# Patient Record
Sex: Female | Born: 1955 | Race: Black or African American | Hispanic: No | Smoking: Former smoker
Health system: Southern US, Community
[De-identification: ages and names within clinical notes are randomized; demographics above are authoritative.]

## PROBLEM LIST (undated history)

## (undated) DIAGNOSIS — E7800 Pure hypercholesterolemia, unspecified: Secondary | ICD-10-CM

## (undated) HISTORY — DX: Pure hypercholesterolemia, unspecified: E78.00

## (undated) HISTORY — PX: KNEE SURGERY: SHX244

## (undated) HISTORY — PX: BUNIONECTOMY: SHX129

## (undated) HISTORY — PX: CERVICAL SPINE SURGERY: SHX589

## (undated) HISTORY — PX: OTHER SURGICAL HISTORY: SHX169

---

## 1998-05-16 ENCOUNTER — Ambulatory Visit (HOSPITAL_BASED_OUTPATIENT_CLINIC_OR_DEPARTMENT_OTHER): Admission: RE | Admit: 1998-05-16 | Discharge: 1998-05-16 | Payer: Self-pay | Admitting: Orthopedic Surgery

## 1998-08-22 ENCOUNTER — Ambulatory Visit (HOSPITAL_BASED_OUTPATIENT_CLINIC_OR_DEPARTMENT_OTHER): Admission: RE | Admit: 1998-08-22 | Discharge: 1998-08-22 | Payer: Self-pay | Admitting: Orthopedic Surgery

## 1999-01-23 ENCOUNTER — Encounter: Payer: Self-pay | Admitting: Obstetrics and Gynecology

## 1999-01-23 ENCOUNTER — Ambulatory Visit (HOSPITAL_COMMUNITY): Admission: RE | Admit: 1999-01-23 | Discharge: 1999-01-23 | Payer: Self-pay | Admitting: Obstetrics and Gynecology

## 1999-08-28 ENCOUNTER — Ambulatory Visit (HOSPITAL_BASED_OUTPATIENT_CLINIC_OR_DEPARTMENT_OTHER): Admission: RE | Admit: 1999-08-28 | Discharge: 1999-08-28 | Payer: Self-pay | Admitting: Orthopedic Surgery

## 1999-12-18 ENCOUNTER — Encounter: Payer: Self-pay | Admitting: Neurosurgery

## 1999-12-23 ENCOUNTER — Inpatient Hospital Stay (HOSPITAL_COMMUNITY): Admission: RE | Admit: 1999-12-23 | Discharge: 1999-12-25 | Payer: Self-pay | Admitting: Neurosurgery

## 1999-12-23 ENCOUNTER — Encounter: Payer: Self-pay | Admitting: Neurosurgery

## 2000-01-13 ENCOUNTER — Encounter: Admission: RE | Admit: 2000-01-13 | Discharge: 2000-01-13 | Payer: Self-pay | Admitting: Neurosurgery

## 2000-01-13 ENCOUNTER — Encounter: Payer: Self-pay | Admitting: Neurosurgery

## 2000-02-08 ENCOUNTER — Encounter: Payer: Self-pay | Admitting: Neurosurgery

## 2000-02-08 ENCOUNTER — Ambulatory Visit (HOSPITAL_COMMUNITY): Admission: RE | Admit: 2000-02-08 | Discharge: 2000-02-08 | Payer: Self-pay | Admitting: Neurosurgery

## 2000-04-05 ENCOUNTER — Encounter: Admission: RE | Admit: 2000-04-05 | Discharge: 2000-04-05 | Payer: Self-pay | Admitting: Neurosurgery

## 2000-04-05 ENCOUNTER — Encounter: Payer: Self-pay | Admitting: Neurosurgery

## 2000-06-06 ENCOUNTER — Encounter: Admission: RE | Admit: 2000-06-06 | Discharge: 2000-06-06 | Payer: Self-pay | Admitting: Neurosurgery

## 2000-06-06 ENCOUNTER — Encounter: Payer: Self-pay | Admitting: Neurosurgery

## 2000-06-21 ENCOUNTER — Ambulatory Visit (HOSPITAL_COMMUNITY): Admission: RE | Admit: 2000-06-21 | Discharge: 2000-06-21 | Payer: Self-pay | Admitting: Neurosurgery

## 2000-06-21 ENCOUNTER — Encounter: Payer: Self-pay | Admitting: Neurosurgery

## 2000-10-26 ENCOUNTER — Ambulatory Visit (HOSPITAL_COMMUNITY): Admission: RE | Admit: 2000-10-26 | Discharge: 2000-10-26 | Payer: Self-pay | Admitting: Orthopedic Surgery

## 2000-10-26 ENCOUNTER — Encounter: Payer: Self-pay | Admitting: Orthopedic Surgery

## 2000-12-20 ENCOUNTER — Other Ambulatory Visit: Admission: RE | Admit: 2000-12-20 | Discharge: 2000-12-20 | Payer: Self-pay | Admitting: *Deleted

## 2003-05-20 ENCOUNTER — Emergency Department (HOSPITAL_COMMUNITY): Admission: EM | Admit: 2003-05-20 | Discharge: 2003-05-21 | Payer: Self-pay | Admitting: Emergency Medicine

## 2004-09-16 ENCOUNTER — Emergency Department (HOSPITAL_COMMUNITY): Admission: EM | Admit: 2004-09-16 | Discharge: 2004-09-17 | Payer: Self-pay | Admitting: *Deleted

## 2005-09-15 ENCOUNTER — Emergency Department (HOSPITAL_COMMUNITY): Admission: EM | Admit: 2005-09-15 | Discharge: 2005-09-15 | Payer: Self-pay | Admitting: Emergency Medicine

## 2006-01-23 ENCOUNTER — Emergency Department (HOSPITAL_COMMUNITY): Admission: EM | Admit: 2006-01-23 | Discharge: 2006-01-23 | Payer: Self-pay | Admitting: Family Medicine

## 2008-07-12 ENCOUNTER — Inpatient Hospital Stay (HOSPITAL_COMMUNITY): Admission: RE | Admit: 2008-07-12 | Discharge: 2008-07-16 | Payer: Self-pay | Admitting: Urology

## 2008-09-23 ENCOUNTER — Encounter: Payer: Self-pay | Admitting: Family

## 2009-05-02 IMAGING — CT CT ABDOMEN W/O CM
2 of 4 series · 17 of 46 positions shown, 19 images · non-contrast
Comparison: CT scan performed at [HOSPITAL] on 06/19/2008

CLINICAL DATA: Left flank pain.  Status post left renal stent
placed yesterday.

CT ABDOMEN WITHOUT CONTRAST
TECHNIQUE: Multidetector CT imaging of the abdomen was performed
following the standard protocol without IV contrast.

[Series 2: stone_wo 5.0 b40f st · axial · 0.64mm/px · z∈[-252,-27]mm · 14 of 51 slices shown, 16 images]
[im 3/51  soft-tissue]
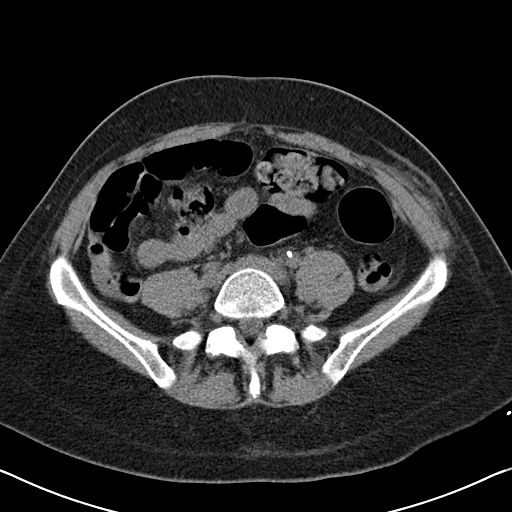
[im 3/51  bone]
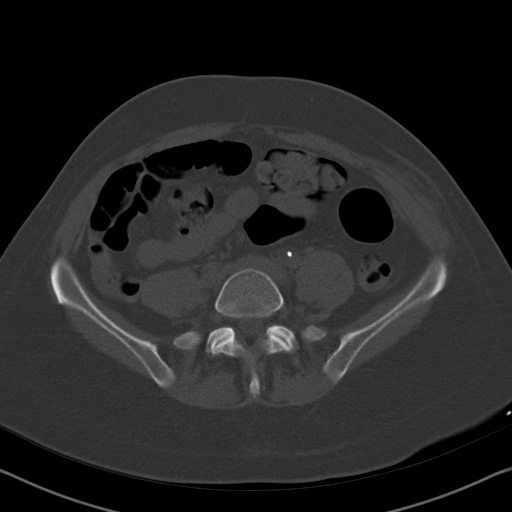
[im 7/51  soft-tissue]
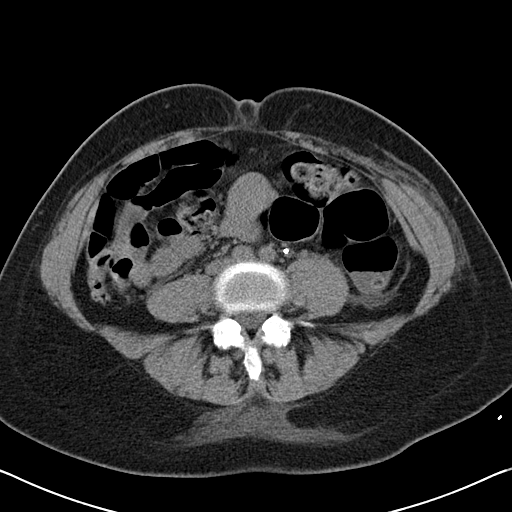
[im 9/51  soft-tissue]
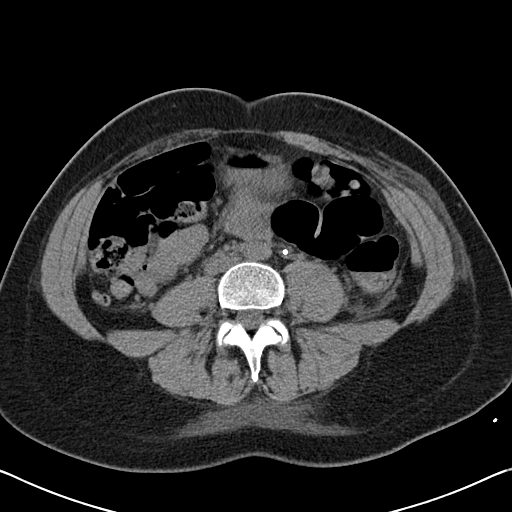
[im 14/51  soft-tissue]
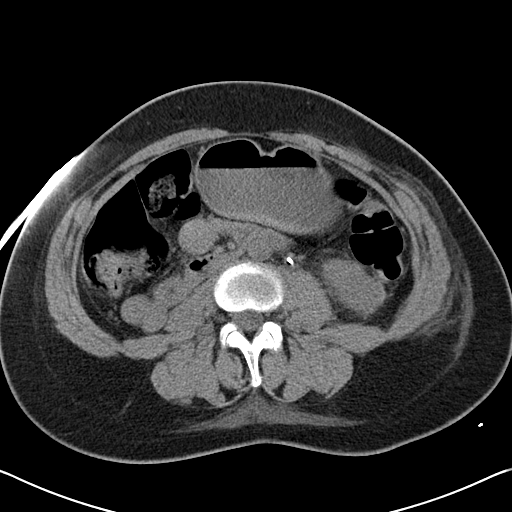
[im 18/51  soft-tissue]
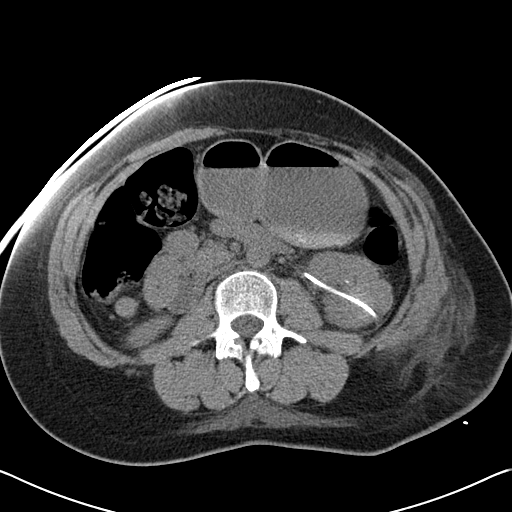
[im 20/51  soft-tissue]
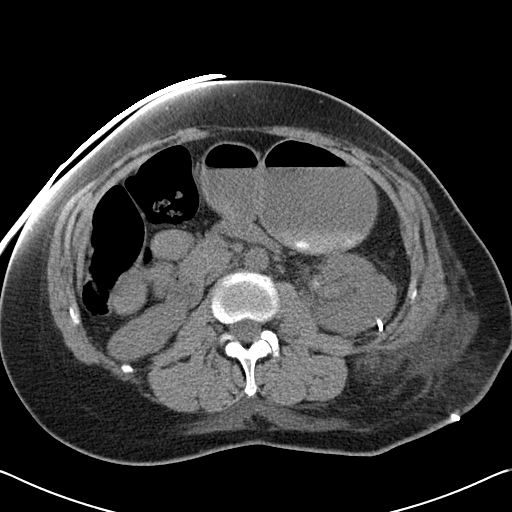
[im 24/51  soft-tissue]
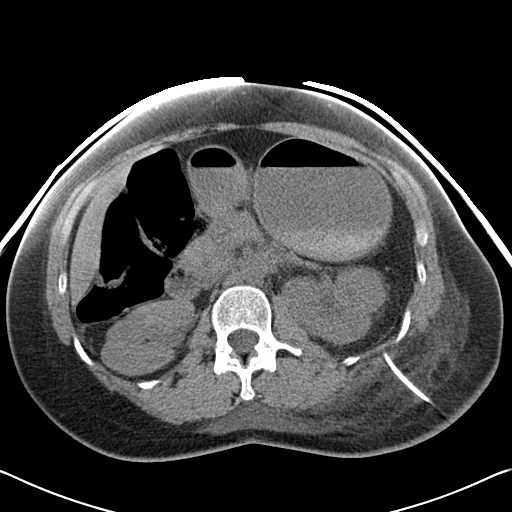
[im 27/51  soft-tissue]
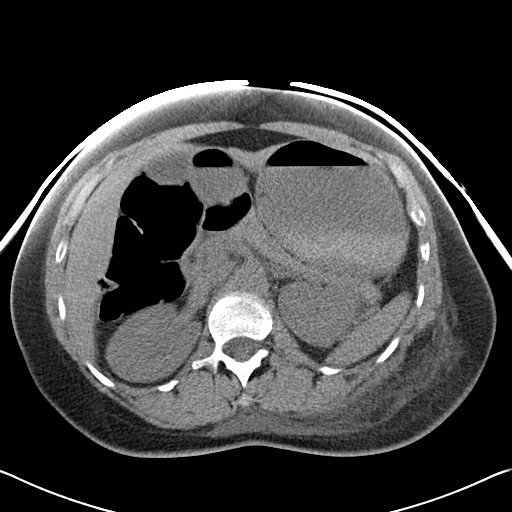
[im 31/51  soft-tissue]
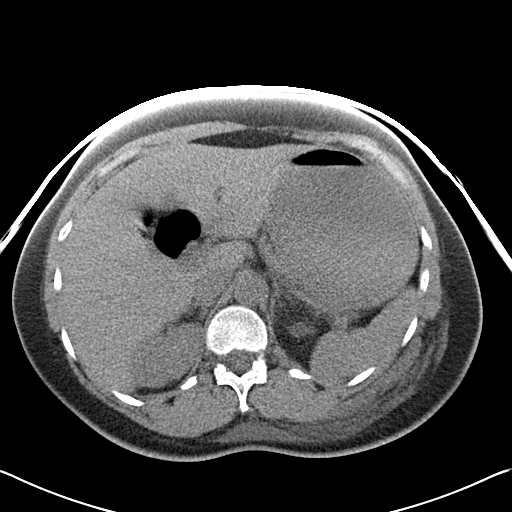
[im 31/51  bone]
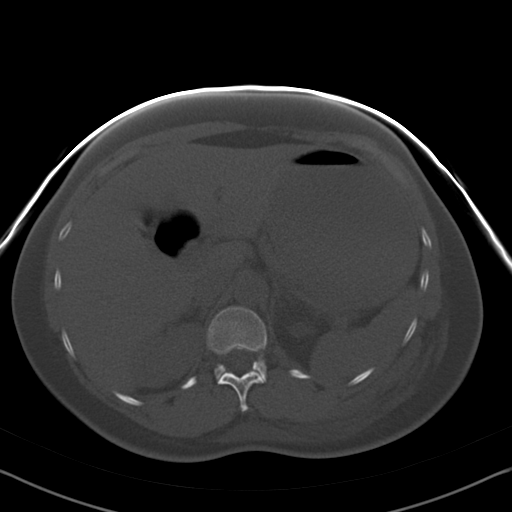
[im 33/51  soft-tissue]
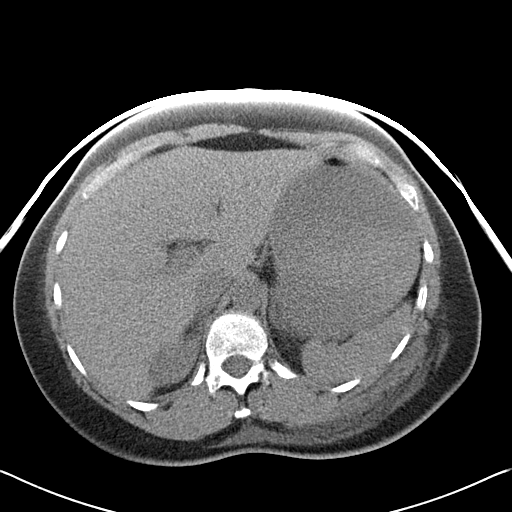
[im 37/51  soft-tissue]
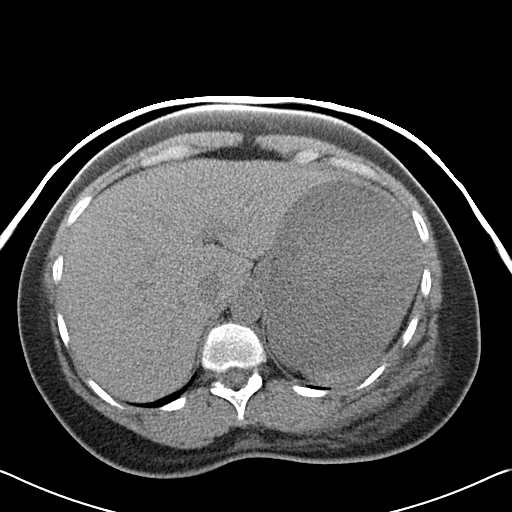
[im 42/51  soft-tissue]
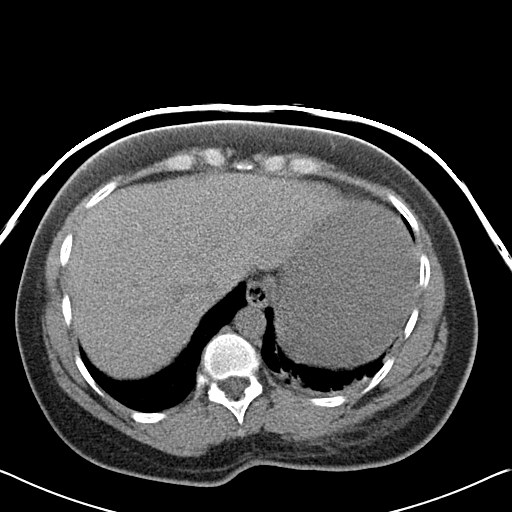
[im 44/51  soft-tissue]
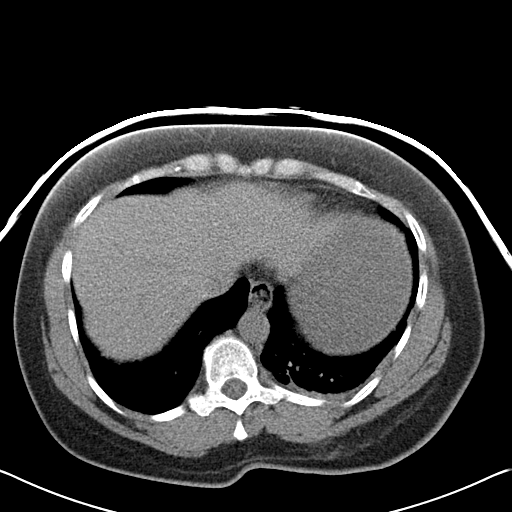
[im 48/51  soft-tissue]
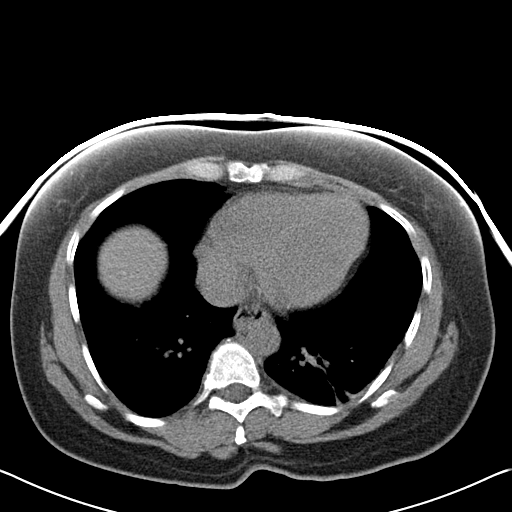

[Series 602: coronal abdomen · coronal · 0.64mm/px · 3 of 114 slices shown]
[im 38/114  soft-tissue]
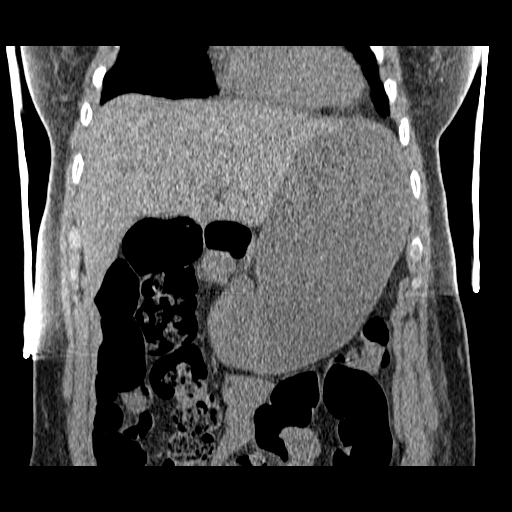
[im 51/114  soft-tissue]
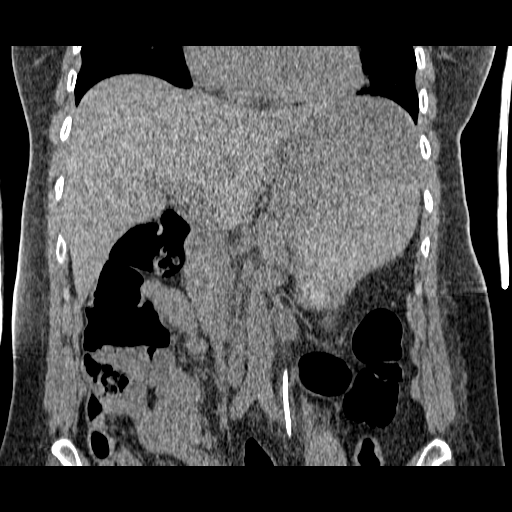
[im 63/114  soft-tissue]
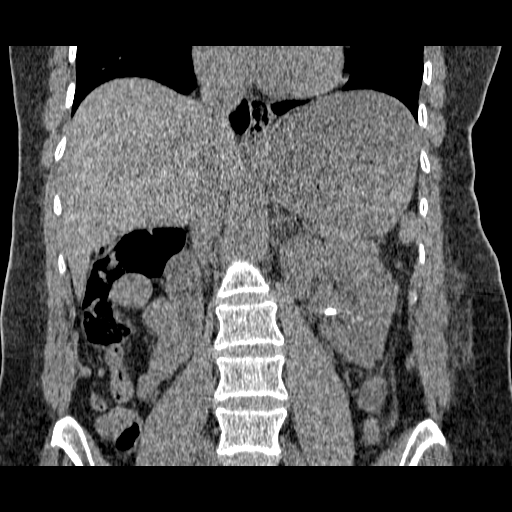

[17 of 46 positions shown; findings below may reference images not displayed]

FINDINGS: The large stone seen on the prior CT scan has been
removed.  There are two tiny stones in the midportion of the left
kidney.  There is a left ureteral stent catheter in place in good
position.  There is only minimal perinephric stranding on the left.
There is some edema in the subcutaneous fat of the left flank which
is thought to be postoperative in origin.  There is no definable
hematoma.  There is slight atelectasis at the left lung base.

The adjacent spleen and left adrenal gland are normal.

The unenhanced liver and pancreas and right adrenal gland and right
kidney appear normal.

There is no free air or free fluid.
IMPRESSION: Satisfactory postoperative appearance of the abdomen with a left
ureteral stent in place.  The stone seen in the renal pelvis on the
prior CT scan has been removed.  There are two small calculi in the
mid left kidney.

Expected edema in the soft tissues of the left flank adjacent to
the site of the stent placement.  No significant left
hydronephrosis.

## 2010-01-19 ENCOUNTER — Ambulatory Visit: Payer: Self-pay | Admitting: Family

## 2010-01-19 ENCOUNTER — Ambulatory Visit (HOSPITAL_BASED_OUTPATIENT_CLINIC_OR_DEPARTMENT_OTHER): Admission: RE | Admit: 2010-01-19 | Discharge: 2010-01-19 | Payer: Self-pay | Admitting: Internal Medicine

## 2010-01-19 ENCOUNTER — Ambulatory Visit: Payer: Self-pay | Admitting: Diagnostic Radiology

## 2010-01-19 DIAGNOSIS — N951 Menopausal and female climacteric states: Secondary | ICD-10-CM | POA: Insufficient documentation

## 2010-01-20 ENCOUNTER — Telehealth: Payer: Self-pay | Admitting: Family

## 2010-01-21 ENCOUNTER — Encounter: Payer: Self-pay | Admitting: Family

## 2010-01-22 ENCOUNTER — Telehealth: Payer: Self-pay | Admitting: Family

## 2010-02-03 ENCOUNTER — Telehealth: Payer: Self-pay | Admitting: Family

## 2010-02-03 ENCOUNTER — Encounter: Payer: Self-pay | Admitting: Family

## 2010-02-04 ENCOUNTER — Encounter: Payer: Self-pay | Admitting: Family

## 2010-02-10 ENCOUNTER — Encounter: Admission: RE | Admit: 2010-02-10 | Discharge: 2010-02-10 | Payer: Self-pay | Admitting: Internal Medicine

## 2010-02-11 ENCOUNTER — Telehealth: Payer: Self-pay | Admitting: Family

## 2010-05-09 ENCOUNTER — Encounter: Payer: Self-pay | Admitting: Obstetrics and Gynecology

## 2010-05-17 LAB — CONVERTED CEMR LAB
ALT: 18 units/L (ref 0–35)
AST: 18 units/L (ref 0–37)
Albumin: 4.4 g/dL (ref 3.5–5.2)
Alkaline Phosphatase: 78 units/L (ref 39–117)
BUN: 21 mg/dL (ref 6–23)
Bilirubin, Direct: 0.1 mg/dL (ref 0.0–0.3)
Chloride: 109 meq/L (ref 96–112)
Glucose, Bld: 72 mg/dL (ref 70–99)
Lymphs Abs: 2.3 10*3/uL (ref 0.7–4.0)
Monocytes Relative: 9 % (ref 3–12)
Neutro Abs: 1.6 10*3/uL — ABNORMAL LOW (ref 1.7–7.7)
Neutrophils Relative %: 38 % — ABNORMAL LOW (ref 43–77)
Pap Smear: NORMAL
Potassium: 4.3 meq/L (ref 3.5–5.3)
RBC: 4.7 M/uL (ref 3.87–5.11)
TSH: 0.558 microintl units/mL (ref 0.350–4.500)
WBC: 4.3 10*3/uL (ref 4.0–10.5)

## 2010-05-18 ENCOUNTER — Telehealth: Payer: Self-pay | Admitting: Family

## 2010-05-18 ENCOUNTER — Encounter: Payer: Self-pay | Admitting: Family

## 2010-05-19 NOTE — Assessment & Plan Note (Signed)
Summary: new to be est [employer]--Rm 5   Vital Signs:  Patient profile:   55 year old female LMP:     04/19/2002 Height:      63 inches Weight:      158.50 pounds BMI:     28.18 Temp:     97.9 degrees F oral Pulse rate:   66 / minute Pulse rhythm:   regular Resp:     16 per minute BP sitting:   100 / 76  (right arm) Cuff size:   regular  Vitals Entered By: Mervin Kung CMA (AAMA) (January 19, 2010 10:10 AM) CC: Rm 5  Pt new to establish care. Pt needs work physical.   Is Patient Diabetic? No Comments Pt would like to discuss treatment of hot flashes. Nicki Guadalajara Fergerson CMA Duncan Dull)  January 19, 2010 10:17 AM  LMP (date): 04/19/2002     Enter LMP: 04/19/2002 Last PAP Result normal   CC:  Rm 5  Pt new to establish care. Pt needs work physical.  .  History of Present Illness: Ms Sullivant is a 55 year old female who presents today to establish care.  She wishes to have a complete physical, and also wishes to address her hot flashes.  She has a form which she would like to have completed for her job.  Previously, she  has been followed by a Chief Operating Officer in [place].  She does not recall her name, but will contact us with this information so that we can retrieve her old records.   1. Preventative- Pt is up to date on colonoscopy, requesting flu shot.  She believes that she is up to date on tetanus- has records at home and will contact us with this information.  Patient is due for mammogram.  Last Pap was 1 year ago and was normal.  Pt reports that she has received regular pap smears and that they have always been normal.    2. Cervical spine fusion 2005- no symptoms at present.    3. Hot flashes-  x 1 year. Having symptoms every hour.  Feels like it is affecting her at work.  Hard for her to sleep.  LMP was 7 years ago.    Preventive Screening-Counseling & Management  Alcohol-Tobacco     Alcohol drinks/day: 0     Smoking Status: never  Caffeine-Diet-Exercise     Caffeine  use/day: <1 daily     Does Patient Exercise: no  Allergies (verified): 1)  ! * Dilaudid  Past History:  Past Medical History: None  Past Surgical History: Percutaneous kidney stone removed-- 2010 Left knee surgery-- 1975 / 1976 Cervical spine surgery-- 2005 Right rotator cuff repair-- 1999  Family History: Father-- living; diabetes, htn, gout, cancer left kidney Mother--living, HTN, hypercholesterolemia, carpal tunnel syndrome 1 brother--htn 1 sister--htn 1 daughter--healthy Denies family medical history of breast cancer.    Social History: Occupation: Works as  Engineer, civil (consulting) Divorced Never Smoked Alcohol use-no Regular exercise-no Smoking Status:  never Caffeine use/day:  <1 daily Does Patient Exercise:  no  Review of Systems       Constitutional: Denies Fever ENT:  Denies nasal congestion or sore throat. Resp: Denies cough CV:  Denies Chest Pain or shortness of breath GI:  Denies nausea or vomitting GU: Denies dysuria Lymphatic: Denies lymphadenopathy Musculoskeletal:  some soreness on the left side of her neck Skin:  Denies Rashes Psychiatric: some depression since her hot flashes started.  Neuro: Denies numbness     Physical  Exam  General:  Well-developed,well-nourished,in no acute distress; alert,appropriate and cooperative throughout examination Head:  Normocephalic and atraumatic without obvious abnormalities. No apparent alopecia or balding. Eyes:  PERRLA Ears:  External ear exam shows no significant lesions or deformities.  Otoscopic examination reveals clear canals, tympanic membranes are intact bilaterally without bulging, retraction, inflammation or discharge. Hearing is grossly normal bilaterally. Mouth:  Oral mucosa and oropharynx without lesions or exudates.  Teeth in good repair. Neck:  No deformities, masses, or tenderness noted. Breasts:  No mass, nodules, thickening, tenderness, bulging, retraction, inflamation, nipple discharge or skin changes  noted.   Lungs:  Normal respiratory effort, chest expands symmetrically. Lungs are clear to auscultation, no crackles or wheezes. Heart:  Normal rate and regular rhythm. S1 and S2 normal without gallop, murmur, click, rub or other extra sounds. Abdomen:  Bowel sounds positive,abdomen soft and non-tender without masses, organomegaly or hernias noted. Genitalia:  deferred Msk:  No deformity or scoliosis noted of thoracic or lumbar spine.   Pulses:  R and L carotid,radial,femoral,dorsalis pedis and posterior tibial pulses are full and equal bilaterally Extremities:  No clubbing, cyanosis, edema, or deformity noted with normal full range of motion of all joints.   Neurologic:  No cranial nerve deficits noted. Station and gait are normal. Plantar reflexes are down-going bilaterally. DTRs are symmetrical throughout. Sensory, motor and coordinative functions appear intact. Skin:  Intact without suspicious lesions or rashes Psych:  Cognition and judgment appear intact. Alert and cooperative with normal attention span and concentration. No apparent delusions, illusions, hallucinations   Impression & Recommendations:  Problem # 1:  Preventive Health Care (ICD-V70.0) Assessment Comment Only Flu shot given today.  Patient was counselled on importance of regular exercise.  Referral provided for mammogram.  Check labs as below.  Will plan to check FLP next visit fasting.  Will plan to repeat her pap smear in 3 years as all pap smears have been reported as normal and pt is not increased risk.   Orders: TLB-BMP (Basic Metabolic Panel-BMET) (80048-METABOL) TLB-CBC Platelet - w/Differential (85025-CBCD) TLB-Hepatic/Liver Function Pnl (80076-HEPATIC) TLB-TSH (Thyroid Stimulating Hormone) (84443-TSH) Mammogram (Screening) (Mammo) EKG w/ Interpretation (93000)  Problem # 2:  MENOPAUSE-RELATED VASOMOTOR SYMPTOMS, HOT FLASHES (ICD-627.2) Assessment: Deteriorated Recommended trial of effexor.  Initially she  agreed to this, however she ultimately declined due to concern about difficulty weaning off of med in the future.  Will plan to check below labs and will consider low dose HRT.   Orders: T-Estradiol [id number]) T-FSH [id number]) T-LH [id number])  Complete Medication List: 1)  Multivitamins Tabs (Multiple vitamin) .... One tablet by mouth daily 2)  Venlafaxine Hcl 37.5 Mg Tabs (Venlafaxine hcl) .... One tablet by mouth two times a day  Other Orders: Admin 1st Vaccine (57846) Flu Vaccine 91yrs + (96295)  Patient Instructions: 1)  Please call us with the information on the name of your former health care provider.   2)  Try to walk 30 minutes a day.  3)  Schedule mammogram downstairs today. 4)  Pls complete your lab work on the first floor. 5)  Please return fasting to the lab at your earliest convenience to complete your fasting cholesterol. 6)  Follow up in 1 month- come fasting to this appointment. We will plan to do your cholesterol that day and see how things are going with your hot flashes. 7)  Welcome to [facility]! Prescriptions: VENLAFAXINE HCL 37.5 MG TABS (VENLAFAXINE HCL) one tablet by mouth two times a day  #60 x 1  Entered and Authorized by:   Lemont Fillers FNP   Signed by:   Lemont Fillers FNP on 01/19/2010   Method used:   Electronically to        [facility] # [phone]* (retail)       [address]       [place], [place]  [address]       Ph: [phone]       Fax: [phone]   RxID:   [id number]  Pt decided not to try Venlafaxine. Melissa was notified and rx was cancelled at [facility]. Nicki Guadalajara Fergerson CMA Duncan Dull)  January 19, 2010 12:04 PM  Current Allergies (reviewed today): ! * DILAUDID   Preventive Care Screening  PPD:    Date:  10/17/2009    Results:  negative   Colonoscopy:    Date:  11/17/2008    Results:  normal   Mammogram:    Date:  11/17/2008    Results:  normal   Pap Smear:    Date:   10/17/2008    Results:  normal      Never had bone density. Can't remember last tetanus. Nicki Guadalajara Fergerson CMA Duncan Dull)  January 19, 2010 10:23 AM    Flu Vaccine Consent Questions     Do you have a history of severe allergic reactions to this vaccine? no    Any prior history of allergic reactions to egg and/or gelatin? no    Do you have a sensitivity to the preservative Thimersol? no    Do you have a past history of Guillan-Barre Syndrome? no    Do you currently have an acute febrile illness? no    Have you ever had a severe reaction to latex? no    Vaccine information given and explained to patient? yes    Are you currently pregnant? no    Lot Number:[id number]   Exp Date:10/17/2010   Site Given  Left Deltoid IM. Nicki Guadalajara Fergerson CMA Duncan Dull)  January 19, 2010 11:26 AM

## 2010-05-19 NOTE — Letter (Signed)
Summary: Employee Health Form/[employer]  Needs OV/[employer]   Imported By: Lanelle Bal 02/06/2010 12:07:51  _____________________________________________________________________  External Attachment:    Type:   Image     Comment:   External Document

## 2010-05-19 NOTE — Letter (Signed)
Summary: Records from [facility] 2010  Records from [facility] 2010   Imported By: Maryln Gottron 02/13/2010 15:14:49  _____________________________________________________________________  External Attachment:    Type:   Image     Comment:   External Document

## 2010-05-19 NOTE — Progress Notes (Signed)
  Phone Note Outgoing Call   Call placed by: Lemont Fillers FNP,  January 20, 2010 4:50 PM Call placed to: L Summary of Call: Left message for patient to return my call.  Initial call taken by: Lemont Fillers FNP,  January 20, 2010 4:51 PM  Follow-up for Phone Call        Spoke with Ms Neyman, reviewed laboratory data.  She reports that she has tried lexapro and gabapentin in the past for her hot flashes which has not helped.  Declines effexor.  Discussed my concern with patient about the fact that her LMP was 7 years ago.  Will plan to refer to GYN for further evaluation and risk assessment for HRT. Pt is agreeable to this plan.  Pt is requesting that her employee health form be faxed to her employer.  Follow-up by: Lemont Fillers FNP,  January 21, 2010 8:53 AM

## 2010-05-19 NOTE — Progress Notes (Signed)
Summary: breast u/s resul  Phone Note Outgoing Call   Summary of Call: Please call patient and let her know that her breast ultrasound shows a cyst.  No sign of breast cancer at this time.  She will need a follow up mammogram in 1 year. Initial call taken by: Lemont Fillers FNP,  February 11, 2010 8:51 AM  Follow-up for Phone Call        Left message on machine to return my call. Nicki Guadalajara Fergerson CMA Duncan Dull)  February 11, 2010 10:42 AM   Additional Follow-up for Phone Call Additional follow up Details #1::        Pt notified and voices understanding. Nicki Guadalajara Fergerson CMA Duncan Dull)  February 11, 2010 10:54 AM

## 2010-05-19 NOTE — Progress Notes (Signed)
Summary: B12 check  Phone Note Call from Patient Call back at [phone]   Caller: Patient Reason for Call: Insurance Question Summary of Call: Pt would like to know if we checked her B vitamin level Initial call taken by: Lannette Donath,  January 22, 2010 1:08 PM  Follow-up for Phone Call        Pt would like [place] to add B12 level to most recent labs due to constant fatigue. Please advise. Nicki Guadalajara Fergerson CMA Duncan Dull)  January 22, 2010 2:54 PM  Follow-up by: Mervin Kung CMA Duncan Dull),  January 22, 2010 2:54 PM  Additional Follow-up for Phone Call Additional follow up Details #1::        Please let patient know that I am happy to add this test, but it is unlikely to be low based on her blood count results and I am not sure that it will be covered by insurance. Additional Follow-up by: Lemont Fillers FNP,  January 23, 2010 8:46 AM    Additional Follow-up for Phone Call Additional follow up Details #2::    Left message on machine to return my call. Nicki Guadalajara Fergerson CMA Duncan Dull)  January 23, 2010 10:42 AM   Pt returned my call and left message requesting that I leave info. on home voicemail.  Left message on machine regarding [name]'s instructions. Asked pt to call me back today and let me know if she still wants B12 added. Also, did pt sign form for job. Nicki Guadalajara Fergerson CMA Duncan Dull)  January 23, 2010 3:00 PM   Pt called back & said not to add B levels [name]  January 23, 2010 4:44 PM  Spoke to pt and she states that she will stop by on Wednesday to sign form for job.  Form has been left at front desk for signature. Nicki Guadalajara Fergerson CMA Duncan Dull)  January 26, 2010 9:43 AM   Additional Follow-up for Phone Call Additional follow up Details #3:: Details for Additional Follow-up Action Taken: Form was signed and pt stated she would fax it herself. Nicki Guadalajara Fergerson CMA Duncan Dull)  January 28, 2010 2:49 PM

## 2010-05-19 NOTE — Progress Notes (Signed)
Summary: RECORDS REC FROM [facility]  Phone Note Other Incoming   Caller: [facility] Summary of Call: RECORDS [facility] FROM [facility] Initial call taken by: Roselle Locus,  February 03, 2010 8:56 AM

## 2010-05-19 NOTE — Miscellaneous (Signed)
Summary: mammogram, tetanus,pneumovax  Clinical Lists Changes  Observations: Added new observation of MAMMOGRAM: normal (10/01/2008 10:31) Added new observation of TD BOOSTER: Historical (09/23/2008 10:31) Added new observation of PNEUMOVAX: Historical (04/20/2007 10:39)      Preventive Care Screening  Mammogram:    Date:  10/01/2008    Results:  normal  Last Tetanus Booster:    Date:  09/23/2008    Results:  Historical    Immunization History:  Pneumovax Immunization History:    Pneumovax:  historical (04/20/2007)

## 2010-05-19 NOTE — Miscellaneous (Signed)
Summary: tetanus  Clinical Lists Changes  Observations: Added new observation of TD BOOSTER: Historical (04/19/2006 13:26)      Immunization History:  Tetanus/Td Immunization History:    Tetanus/Td:  historical (04/19/2006)

## 2010-05-27 NOTE — Progress Notes (Signed)
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 Please advise.  Initial call taken by: Mervin Kung CMA Duncan Dull),  May 18, 2010 2:41 PM  Follow-up for Phone Call        See letter.  Follow-up by: Lemont Fillers FNP,  May 18, 2010 3:04 PM  Additional Follow-up for Phone Call Additional follow up Details #1::        Pt notified letter is ready for pick up and will be at the front desk. Nicki Guadalajara Fergerson CMA Duncan Dull)  May 18, 2010 3:33 PM

## 2010-05-27 NOTE — Letter (Signed)
Summary: Generic Letter  [place] [facility]  [address]   [place], [place] [address]   Phone: [phone]  Fax: [phone]    05/18/2010  [name] [address] [place], [place]  [address]  To whom it may concern,  [name] was seen on 01/19/10 for a complete physical and had her flu shot during that visit.     Sincerely,   Sandford Craze FNP  Appended Document: Generic Letter mailed

## 2010-07-30 LAB — URINALYSIS, ROUTINE W REFLEX MICROSCOPIC
Glucose, UA: NEGATIVE mg/dL
Ketones, ur: NEGATIVE mg/dL
Specific Gravity, Urine: 1.012 (ref 1.005–1.030)
pH: 6 (ref 5.0–8.0)

## 2010-07-30 LAB — CBC
HCT: 32.5 % — ABNORMAL LOW (ref 36.0–46.0)
Hemoglobin: 10.9 g/dL — ABNORMAL LOW (ref 12.0–15.0)
Hemoglobin: 13.5 g/dL (ref 12.0–15.0)
MCHC: 33.5 g/dL (ref 30.0–36.0)
MCHC: 33.5 g/dL (ref 30.0–36.0)
MCV: 94.7 fL (ref 78.0–100.0)
MCV: 95.7 fL (ref 78.0–100.0)
Platelets: 212 10*3/uL (ref 150–400)
Platelets: 230 10*3/uL (ref 150–400)
Platelets: 231 10*3/uL (ref 150–400)
RDW: 11.9 % (ref 11.5–15.5)
RDW: 12 % (ref 11.5–15.5)
RDW: 12.3 % (ref 11.5–15.5)

## 2010-07-30 LAB — TYPE AND SCREEN
ABO/RH(D): O POS
Antibody Screen: NEGATIVE

## 2010-07-30 LAB — BASIC METABOLIC PANEL
BUN: 14 mg/dL (ref 6–23)
CO2: 25 mEq/L (ref 19–32)
Calcium: 9 mg/dL (ref 8.4–10.5)
Creatinine, Ser: 1.01 mg/dL (ref 0.4–1.2)
GFR calc non Af Amer: 58 mL/min — ABNORMAL LOW (ref >60)
Glucose, Bld: 101 mg/dL — ABNORMAL HIGH (ref 70–99)
Sodium: 143 mEq/L (ref 135–145)

## 2010-07-30 LAB — URINE CULTURE: Special Requests: NEGATIVE

## 2010-07-30 LAB — URINE MICROSCOPIC-ADD ON

## 2010-09-01 NOTE — Op Note (Signed)
NAMECAILEIGH, CANCHE               ACCOUNT NO.:  [id number]   MEDICAL RECORD NO.:  [id number]          PATIENT TYPE:  INP   LOCATION:  0005                         FACILITY:  [facility]   PHYSICIAN:  Bertram Millard. Dahlstedt, M.D.DATE OF BIRTH:  Jul 24, 1955   DATE OF PROCEDURE:  07/12/2008  DATE OF DISCHARGE:                               OPERATIVE REPORT   PREOPERATIVE DIAGNOSIS:  A 13-mm left ureteropelvic junction stone.   POSTOPERATIVE DIAGNOSIS:  A 13-mm left ureteropelvic junction stone.   PRINCIPAL PROCEDURE:  Left percutaneous nephrolithotomy.   SURGEON:  Bertram Millard. Dahlstedt, M.D.   RADIOLOGIST:  Judie Petit. Ruel Favors, M.D.   ANESTHESIA:  General endotracheal.   COMPLICATIONS:  None.   BRIEF HISTORY:  This is a 55 year old female with a symptomatic left UPJ  stone with hydronephrosis.  She was originally seen and evaluated by Dr.  Larey Dresser.  She was subsequently referred to me for percutaneous  nephrolithotomy.  On initial presentation, Dr. Vonita Moss explained the  stone, as well as treatment options.  The patient does not desire to  have a stent, and would like to have a percutaneous nephrolithotomy.  She was then sent to me, and I re-explained surgical options.  She  desires this percutaneous nephrolithotomy.  She is aware of risks and  complications and desires to proceed.   DESCRIPTION OF PROCEDURE:  The patient received preoperative Cipro in  the radiology area.  Dr. Ruel Favors placed a percutaneous nephrostomy  tube, with a 4-French catheter being placed all the way in her left  ureter.  She was then identified in the holding area, the surgical side  marked, and she was taken to the operating room.  General anesthetic was  administered.  She was then placed in the prone position after Foley  catheter was placed.  The left flank was prepped and draped.  Dr. Denny Levy  then, with fluoroscopic guidance, eventually placed a 28-French  percutaneous nephrostomy sheath.  I then  used the nephroscope to  identify 2 stone fragments, a large one and a small one, at the left  UPJ.  They were extracted.  Reinspection of the entire lower caliceal  system and the renal pelvis revealed no further stones.  The guidewires  had been placed, one was removed after a large bore catheter was placed  in the renal pelvis.  A nephrostogram was performed which showed  antegrade flow into the ureter.  At this point, a 5-French ureteral  access sheath was placed, ureteral access catheter was placed, and the  guidewires removed.  This was sutured to the skin, with the skin having  been closed with horizontal mattress sutures of 2-0 silk at that point.  The catheter was left capped, it was not draining.  Dry sterile  dressings were then placed.   The procedure was then terminated.  The patient was awakened and taken  to the PACU in stable condition.  There was minimal blood loss.      Bertram Millard. Dahlstedt, M.D.  Electronically Signed     SMD/MEDQ  D:  07/12/2008  T:  07/12/2008  Job:  [id number]   cc:   Renaye Rakers, M.D.  Fax: [phone]
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
## 2010-09-04 NOTE — Discharge Summary (Signed)
[name], [name]               ACCOUNT NO.:  [id number]   MEDICAL RECORD NO.:  [id number]          PATIENT TYPE:  INP   LOCATION:  1434                         FACILITY:  [facility]   PHYSICIAN:  Bertram Millard. 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   BRIEF HISTORY:  This 55 year old female was admitted for treatment of a  moderate left UPJ stone.  The patient was offered stent placement with  lithotripsy.  However, she did not want to have this done.  She  preferred no direct therapy, i.e., percutaneous nephrolithotomy.  She  was first seen and evaluated by Dr. Vonita Moss and referred to me for this  procedure.   Her medical history is significant for a history of anxiety, GERD, she  has had back, foot, knee surgery, and rotator cuff surgery.   MEDICATIONS:  Include Wellbutrin, multivitamins, flaxseed oil.   There are no known drug allergies.   The patient is single but engaged.   ADMISSION DATA:  Includes CBC that was normal, urinalysis normal except  for a few red and white cells, no evidence of infection.  BMET was  normal.   HOSPITAL COURSE:  The patient was admitted directly to the operating  room after a percutaneous nephrostomy tube was placed.  She underwent  left percutaneous nephrolithotomy on July 12, 2008.  Postoperatively,  she had a stable course, but was not very tolerant of the procedure  afterwards, despite not having any significant tube left in.  She  underwent repeat CT scan due to abdominal pain.  This showed minimal  hematoma around the left kidney, very, very tiny fragments left in her  kidney without evidence of renal obstruction.  The patient was  eventually ambulated, started tolerating the pain better, and was  discharged on July 16, 2008.  There was a  temperature the night before  of 101.2, but she was felt to be stable.  She was discharged in stable  condition, was sent home on Percocet and Cipro 1 p.o. b.i.d. as well as  the usual Wellbutrin.  She will follow up the following week in the  office.      Bertram Millard. Dahlstedt, M.D.  Electronically Signed     SMD/MEDQ  D:  08/08/2008  T:  08/08/2008  Job:  [id number]

## 2010-09-04 NOTE — Op Note (Signed)
[place]. [facility]  Patient:    [name], [name]                        MRN: [id number] Adm. Date:  [id number] Disc. Date: [id number] Attending:  Milly Jakob CC:         Harvie Junior, M.D.                           Operative Report  PREOPERATIVE DIAGNOSIS: 1. Retained hardware status post bunion surgery, left foot. 2. Plantar wart, left foot.  POSTOPERATIVE DIAGNOSIS: 1. Retained hardware status post bunion surgery, left foot. 2. Plantar wart, left foot.  OPERATION: 1. Removal of retained hardware, left foot. 2. Removal of plantar wart, left foot.  SURGEON:  Harvie Junior, M.D.  ASSISTANT:  Kerby Less, P.A.  ANESTHESIA:  General.  BRIEF HISTORY:  The patient is a 55 year old female with a long history of having had a bunion surgery done.  She had an excellent result but had some prominence of the screw over the osteotomy dorsally.  Because of concerns of this, she is brought to the operating room for removal.  At the time of surgery, she was noted to have a significant plantar wart in the preoperative area.  We discussed the treatment options and ultimately elected to have this excised, and she also consented for this procedure.  DESCRIPTION OF PROCEDURE:  The patient was brought to the operating room after adequate anesthesia was obtained with general anesthesia.  The patient was placed supine on the operating table.  The left leg was prepped and draped in the usual sterile fashion.  Follow Esmarch exsanguination, blood pressure tourniquet was inflated to 250 mmHg on the calf.  At this point, incision was made through her old incision over the dorsal aspect of the first metatarsal. Subcutaneous tissue was taken down to the level of the fascia which was opened, and the screw was identified and removed with a small fragment screw driver.  The osteotomy site was then evaluated and felt to be stable.  Following this, attention was  turned to the plantar aspect of the foot where there was noted to be a wart in the 1-2 interspace.  This was identified and sharply ellipsed.  The skin was then closed after irrigation of both wounds. A sterile compressive dressing was applied.  The patient was taken to the recovery room where she was noted to be in satisfactory condition. DD:  10/28/99 TD:  10/28/99 Job: [id number]

## 2010-09-04 NOTE — Op Note (Signed)
[place]. [facility]  Patient:    [name]                        MRN: [id number] Proc. Date: 12/23/99 Adm. Date:  [id number] Attending:  Barton Fanny                           Operative Report  PREOPERATIVE DIAGNOSIS:  C4-5, and C5-6 cervical disk herniation.  POSTOPERATIVE DIAGNOSIS:  C4-5 and C5-6 cervical disk herniation.  PROCEDURE:  C4-5 and C5-6 anterior cervical diskectomy and arthrodesis with iliac crest allograft and Theken cervical plating.  SURGEON:  Hewitt Shorts, M.D.  ASSISTANT:  Danae Orleans. Venetia Maxon, M.D.  ANESTHESIA:  General endotracheal.  INDICATIONS:  The patient is a 55 year old woman who presented with a neck, bilateral shoulder, and upper extremity pain and discomfort who was found to have a central disk herniation at C4-5 with advanced degenerative disk disease and spondylosis at C5-6 and a decision was made to proceed with a 2 level anterior cervical diskectomy and arthrodesis.  DESCRIPTION OF PROCEDURE:  The patient was brought to the operating room and placed under general endotracheal anesthesia.  The patient was placed in 10 pounds of Holter traction.  The neck was prepped with Betadine soap and solution and draped in a sterile fashion.  A horizontal incision was made in the left side of the neck.  The line of incision was infiltrated with 1% local anesthetic with epinephrine prior to skin incision.  The incision itself was made with a sharp scalpel and a temperature of 120.  Dissection was carried down to the subcutaneous tissue and platysma and the incision was then carried out through an avascular plane through the sternocleidomastoid, carotid artery, and jugular vein laterally and the trachea and esophagus medially.  The ventral aspect of the vertebral column was identified and a localizing x-ray taken and the C4-5 and C5-6 intervertebral disk space was identified.  Dissection was begun at each level  with incision in the annulus and continued with microcurets, and pituitary rongeurs.  The cartilaginous end plates of the corresponding vertebra were removed using microcurets.  Then the microscope was draped and brought into the field to provide instrumentation, illumination and visualization and the remainder of the procedure performed using microdissection technique.  Posterior osteophytic overgrowth was removed using the midas rex drill with an A2 bur and 2 mm Kerrison punch with a thin footplate.  The disk herniation located essentially at C4-5 was removed as was the posterior longitudinal ligament, however, because the disk herniation was centrally located we did not extend our dissection into the far lateral aspect of the foramen.  However, the foramen as well as the spinal canal and thecal sac were well decompressed.  At the C5-6 level there was advanced degenerative disease and spondylosis with significant osteophytic overgrowth posteriorly as well as in the foramina. This was removed using the Midas Rex drill with an A2 bur and a 2 mm Kerrison punch with a thin foot plate.  Foraminotomies were performed bilaterally and the posterior longitudinal ligament was removed and osteophytic overgrowth was removed and in the end good decompression of the spinal canal, thecal sac, nerve root and foramina were achieved.  At each level hemostasis was established with the use of Gelfoam soaked in thrombin and once the decompression was completed we proceeded with the arthrodesis.  We selected wedges of iliac crest allograft, 8  mm in height, and this was cut to a depth of 11 mm, and placed in the intervertebral disk space and countersunk at each level.  We then removed the cervical traction using a double action rongeur.  Anterior osteophytic overgrowth was removed and then we selected a 32 mm Tehken cervical plate which was positioned over the fusion construct and secured to the vertebra  with a pair of screws at C4 and C6 and a single screw at C5. Each of the screw holes was drilled and tacked and a 12 mm screw placed.  An x-ray at the end showed the plate, grafts and screws were all in good position.  The overall alignment was good.  We irrigated the wound with bacitracin solution and checked for hemostasis which was established and confirmed, and proceeded with closure.  The platysma was closed with interrupted inverted 2-0 running Vicryl sutures, the subcutaneous and subcuticular were closed with interrupted inverted 3-0 running Vicryl sutures and the skin edges were closed with dermabond. Following surgery the patient was placed in a soft cervical collar, reversed from the anesthetic, extubated, and transferred to the recovery room for further care.  ESTIMATED BLOOD LOSS:  150 CC.  Sponge and count were correct. DD:  12/23/99 TD:  12/23/99 Job: [id number]

## 2010-09-04 NOTE — H&P (Signed)
[place]. [facility]  Patient:    [name], [name]                        MRN: [id number] Adm. Date:  [id number] Attending:  Barton Fanny                         History and Physical  PHYSICAL EXAMINATION:  GENERAL: The patient is a well-developed, well-nourished black female in no acute distress.  VITAL SIGNS: Temperature 97.5 degrees, pulse 65, blood pressure 107/62, respiratory rate 14.  Height 5 feet 3 inches.  Weight 132 pounds.  CHEST: Lungs clear to auscultation.  She has symmetrical respiratory excursion.  HEART: Regular rate and rhythm, normal S1 and S2, no murmur.  ABDOMEN: Soft, nondistended.  Bowel sounds present.  EXTREMITIES: No clubbing, cyanosis, or edema.  MUSCULOSKELETAL: Tenderness to palpation throughout the posterior aspect of the neck and interscapular region both over the spinous processes as well as paracervical musculature, with some discomfort to palpation in the lower lumbar region.  Range of motion of neck is limited in all directions due to discomfort.  NEUROLOGIC: Strength is 5/5 through the upper extremities including the deltoids, biceps, triceps, intrinsics, and grip, although she finds it difficult to exert full effort due to discomfort that she experiences. Sensory examination shows intact sensation to pinprick through the upper extremities.  Reflexes are 1-2 in the biceps, brachial radialis, triceps, quadriceps, gastrocnemius and symmetric bilaterally.  Toes downgoing bilaterally.  She has normal gait and stance.  DIAGNOSTIC STUDIES: MRI scan of the cervical spine shows central disk herniation at C4-5 as well as degenerative disk disease and spondylosis at C5-6, with uncinate osteophytic spurring encroaching upon the left C5-6 foramen, with minimal degenerative changes seen at C6-7.  IMPRESSION: Patient with central C4-5 disk herniation and degenerative disk disease and spondylosis at C5-6.  PLAN: The  patient is admitted for two-level, C4-5 and C5-6, anterior cervical diskectomy and arthrodesis with allograft and cervical plating.  We have discussed the nature of surgery, alternatives of surgery, typical length of surgery and hospital stay as well as overall recuperation, limitations during the postoperative period, the need for wear a soft cervical collar during the postoperative period, and risks of surgeries including risk of infection, bleeding, possible need for transfusion, risk of nerve dysfunction with pain, weakness, numbness or paresthesias, the risk of failure of arthrodesis, and anesthetic risks of myocardial infarction, stroke, pneumonia, and death.  She also understands the possibility of progressive degeneration at adjacent disk levels.  Understanding all this she does wish to proceed with surgery, and is admitted for such. DD:  12/23/99 TD:  12/23/99 Job: [id number]

## 2015-04-20 HISTORY — PX: BREAST BIOPSY: SHX20

## 2017-11-01 ENCOUNTER — Other Ambulatory Visit (HOSPITAL_BASED_OUTPATIENT_CLINIC_OR_DEPARTMENT_OTHER): Payer: Self-pay | Admitting: Family Medicine

## 2017-11-01 DIAGNOSIS — Z1231 Encounter for screening mammogram for malignant neoplasm of breast: Secondary | ICD-10-CM

## 2017-11-05 ENCOUNTER — Ambulatory Visit (HOSPITAL_BASED_OUTPATIENT_CLINIC_OR_DEPARTMENT_OTHER)
Admission: RE | Admit: 2017-11-05 | Discharge: 2017-11-05 | Disposition: A | Discharge status: Home / Self Care | Payer: BLUE CROSS/BLUE SHIELD | Source: Ambulatory Visit | Attending: Family Medicine | Admitting: Family Medicine

## 2017-11-05 ENCOUNTER — Encounter (HOSPITAL_BASED_OUTPATIENT_CLINIC_OR_DEPARTMENT_OTHER): Payer: Self-pay

## 2017-11-05 DIAGNOSIS — Z1231 Encounter for screening mammogram for malignant neoplasm of breast: Secondary | ICD-10-CM | POA: Insufficient documentation

## 2017-12-04 ENCOUNTER — Encounter (HOSPITAL_BASED_OUTPATIENT_CLINIC_OR_DEPARTMENT_OTHER): Payer: Self-pay | Admitting: Adult Health

## 2017-12-04 ENCOUNTER — Other Ambulatory Visit: Payer: Self-pay

## 2017-12-04 ENCOUNTER — Emergency Department (HOSPITAL_BASED_OUTPATIENT_CLINIC_OR_DEPARTMENT_OTHER)
Admission: EM | Admit: 2017-12-04 | Discharge: 2017-12-04 | Disposition: A | Discharge status: Home / Self Care | Payer: BLUE CROSS/BLUE SHIELD | Attending: Emergency Medicine | Admitting: Emergency Medicine

## 2017-12-04 DIAGNOSIS — G501 Atypical facial pain: Secondary | ICD-10-CM | POA: Diagnosis present

## 2017-12-04 DIAGNOSIS — R51 Headache: Secondary | ICD-10-CM

## 2017-12-04 DIAGNOSIS — R519 Headache, unspecified: Secondary | ICD-10-CM

## 2017-12-04 LAB — CBC WITH DIFFERENTIAL/PLATELET
Basophils Absolute: 0 10*3/uL (ref 0.0–0.1)
Basophils Relative: 0 %
EOS PCT: 1 %
Eosinophils Absolute: 0.1 10*3/uL (ref 0.0–0.7)
HEMATOCRIT: 37.1 % (ref 36.0–46.0)
HEMOGLOBIN: 12.3 g/dL (ref 12.0–15.0)
LYMPHS PCT: 32 %
Lymphs Abs: 2 10*3/uL (ref 0.7–4.0)
MCH: 32.1 pg (ref 26.0–34.0)
MCHC: 33.2 g/dL (ref 30.0–36.0)
MCV: 96.9 fL (ref 78.0–100.0)
Monocytes Absolute: 0.5 10*3/uL (ref 0.1–1.0)
Monocytes Relative: 9 %
Neutro Abs: 3.5 10*3/uL (ref 1.7–7.7)
Neutrophils Relative %: 58 %
Platelets: 233 10*3/uL (ref 150–400)
RBC: 3.83 MIL/uL — AB (ref 3.87–5.11)
RDW: 12.4 % (ref 11.5–15.5)
WBC: 6.2 10*3/uL (ref 4.0–10.5)

## 2017-12-04 LAB — BASIC METABOLIC PANEL
Anion gap: 9 (ref 5–15)
BUN: 24 mg/dL — AB (ref 6–20)
CHLORIDE: 107 mmol/L (ref 98–111)
CO2: 25 mmol/L (ref 22–32)
Calcium: 9.1 mg/dL (ref 8.9–10.3)
Creatinine, Ser: 1.15 mg/dL — ABNORMAL HIGH (ref 0.44–1.00)
GFR calc Af Amer: 59 mL/min — ABNORMAL LOW (ref >60)
GFR calc non Af Amer: 51 mL/min — ABNORMAL LOW (ref >60)
GLUCOSE: 106 mg/dL — AB (ref 70–99)
POTASSIUM: 3.7 mmol/L (ref 3.5–5.1)
Sodium: 141 mmol/L (ref 135–145)

## 2017-12-04 LAB — SEDIMENTATION RATE: Sed Rate: 11 mm/hr (ref 0–22)

## 2017-12-04 MED ORDER — GABAPENTIN 100 MG PO CAPS: 100.0000 mg | ORAL_CAPSULE | Freq: Three times a day (TID) | ORAL | 0 refills | Status: DC

## 2017-12-04 MED ORDER — NAPROXEN 500 MG PO TABS: 500.0000 mg | ORAL_TABLET | Freq: Two times a day (BID) | ORAL | 0 refills | Status: DC

## 2017-12-04 NOTE — ED Provider Notes (Signed)
[facility] EMERGENCY DEPARTMENT Provider Note   CSN: [id number] Arrival date & time: 12/04/17  1755     History   Chief Complaint Chief Complaint  Patient presents with  . Facial Pain    HPI [name] is a 62 y.o. female.  Patient presents with 2 days of right-sided facial pain.  Patient describes feeling a "desensitization" prior to this.  She reports intermittent sharp stabbing pains in her right forehead and in the area of her right TMJ.  These pains can radiate to the remainder of her face.  She reports having significant swelling it was worse yesterday.  She denies any dental issues and states that she just had a normal checkup at the dentist.  She denies any hearing problems.  No facial weakness or facial droop.  She was concerned it may be she was having some troubles with her cervical spine.  She has had prior frontal headaches but has not seen a neurologist for this.  No improvement with ibuprofen.  No vision loss or blurry vision, but states vision doesn't seem right. No fever, N/V/D.      History reviewed. No pertinent past medical history.  Patient Active Problem List   Diagnosis Date Noted  . MENOPAUSE-RELATED VASOMOTOR SYMPTOMS, HOT FLASHES 01/19/2010    Past Surgical History:  Procedure Laterality Date  . BREAST BIOPSY Left 2017   stereotactic biopsy, benign     OB History   None      Home Medications    Prior to Admission medications   Not on File    Family History History reviewed. No pertinent family history.  Social History Social History   Tobacco Use  . Smoking status: Not on file  Substance Use Topics  . Alcohol use: Not on file  . Drug use: Not on file     Allergies   Hydromorphone hcl   Review of Systems Review of Systems  Constitutional: Negative for fever.  HENT: Positive for facial swelling. Negative for dental problem, ear discharge, ear pain, rhinorrhea, sinus pressure, sore throat and tinnitus.     Eyes: Positive for visual disturbance (non-descript). Negative for discharge, redness and itching.  Respiratory: Negative for cough.   Cardiovascular: Negative for chest pain.  Gastrointestinal: Negative for abdominal pain, diarrhea, nausea and vomiting.  Genitourinary: Negative for dysuria.  Musculoskeletal: Negative for myalgias.  Skin: Negative for rash.  Neurological: Positive for headaches.     Physical Exam Updated Vital Signs BP 117/77 (BP Location: Left Arm)   Pulse 76   Temp 97.8 F (36.6 C) (Oral)   Resp 18   Ht 5\' 3"  (1.6 m)   Wt 72.6 kg   SpO2 100%   BMI 28.34 kg/m   Physical Exam  Constitutional: She appears well-developed and well-nourished.  HENT:  Head: Normocephalic and atraumatic.    Right Ear: Hearing, tympanic membrane, external ear and ear canal normal.  Left Ear: Hearing, tympanic membrane, external ear and ear canal normal.  Nose: No mucosal edema or rhinorrhea.  Mouth/Throat: Uvula is midline, oropharynx is clear and moist and mucous membranes are normal.  No obvious dental abscess.  Patient does have some pain over the right TMJ especially with movement of her jaw.  Eyes: Conjunctivae are normal. Right eye exhibits no discharge. Left eye exhibits no discharge.  Neck: Normal range of motion. Neck supple.  Cardiovascular: Normal rate, regular rhythm and normal heart sounds.  Pulmonary/Chest: Effort normal and breath sounds normal.  Abdominal: Soft. There  is no tenderness.  Neurological: She is alert.  Skin: Skin is warm and dry.  Psychiatric: She has a normal mood and affect.  Nursing note and vitals reviewed.    ED Treatments / Results  Labs (all labs ordered are listed, but only abnormal results are displayed) Labs Reviewed  CBC WITH DIFFERENTIAL/PLATELET - Abnormal; Notable for the following components:      Result Value   RBC 3.83 (*)    All other components within normal limits  BASIC METABOLIC PANEL - Abnormal; Notable for the  following components:   Glucose, Bld 106 (*)    BUN 24 (*)    Creatinine, Ser 1.15 (*)    GFR calc non Af Amer 51 (*)    GFR calc Af Amer 59 (*)    All other components within normal limits  SEDIMENTATION RATE    EKG None  Radiology No results found.  Procedures Procedures (including critical care time)  Medications Ordered in ED Medications - No data to display   Initial Impression / Assessment and Plan / ED Course  I have reviewed the triage vital signs and the nursing notes.  Pertinent labs & imaging results that were available during my care of the patient were reviewed by me and considered in my medical decision making (see chart for details).     Patient seen and examined.  Discussed broad differential with patient including TMJ pain, temporal arteritis, facial or trigeminal nerve irritation, dental etiology.  We will check basic lab work and sed rate to evaluate for any signs of temporal arteritis.  Symptoms are not suspicious for glaucoma and patient does not have any eye pain.   Vital signs reviewed and are as follows: BP 117/77 (BP Location: Left Arm)   Pulse 76   Temp 97.8 F (36.6 C) (Oral)   Resp 18   Ht 5\' 3"  (1.6 m)   Wt 72.6 kg   SpO2 100%   BMI 28.34 kg/m   9:13 PM lab work-up is reassuring including sedimentation rate which is normal.  We will treat patient's symptoms with a course of NSAIDs and gabapentin.  She will follow-up with neurology for further evaluation of her pain.  Encourage PCP follow-up as well.  Patient urged to return with worsening symptoms or other concerns. Patient verbalized understanding and agrees with plan.   Final Clinical Impressions(s) / ED Diagnoses   Final diagnoses:  Right sided facial pain   Patient with sharp, intermittent right-sided facial pain with a neuropathic component.  TMJ pain may also be a possible etiology.  Considered temporal arteritis in this patient -sed rate is normal making this less likely.  No  obvious dental abscess.  Ear canal and mastoid without abnormality.  Will treat patient's symptoms empirically and have her follow-up to evaluate efficacy of treatment.  She may also follow-up with neurology to discuss further eval as needed.  ED Discharge Orders         Ordered    naproxen (NAPROSYN) 500 MG tablet  2 times daily     12/04/17 2108    gabapentin (NEURONTIN) 100 MG capsule  3 times daily     12/04/17 2108    Ambulatory referral to Neurology    Comments:  An appointment is requested in approximately: 2 weeks  Intermittent right-sided facial pains   12/04/17 2109           Renne CriglerGeiple, Tykesha Konicki, PA-C 12/04/17 2116    Arby BarrettePfeiffer, Marcy, MD 12/05/17 1327

## 2017-12-04 NOTE — ED Triage Notes (Addendum)
Presents with sharp pain that begins in her right temporal area and radiates into her  Right cheek and jaw. Non-tender to touch. Pain comes and goes and described as shooting. IT began Friday night and woke her from sleep. She took ibuprofen with mild relief. She endorses some right sided facial swelling Face is symmetrical . She denies blurred vision but states the eye feels funny. Nothing makes the pain better. She believes this may be related to her cervical spine.

## 2017-12-04 NOTE — Discharge Instructions (Signed)
Please read and follow all provided instructions.  Your diagnoses today include:  1. Right sided facial pain     Tests performed today include:  Inflammatory test called a sed rate -was normal  Blood counts and electrolytes  Vital signs. See below for your results today.   Medications prescribed:   Naproxen - anti-inflammatory pain medication  Do not exceed 500mg  naproxen every 12 hours, take with food  You have been prescribed an anti-inflammatory medication or NSAID. Take with food. Take smallest effective dose for the shortest duration needed for your pain. Stop taking if you experience stomach pain or vomiting.    Gabapentin -low-dose medication for nerve pain.  If this is helping, will need to be adjusted.  Take any prescribed medications only as directed.  Home care instructions:  Follow any educational materials contained in this packet.  BE VERY CAREFUL not to take multiple medicines containing Tylenol (also called acetaminophen). Doing so can lead to an overdose which can damage your liver and cause liver failure and possibly death.   Follow-up instructions: Please follow-up with your primary care provider and the neurologist listed for further evaluation of your symptoms.   Return instructions:   Please return to the Emergency Department if you experience worsening symptoms.   Please return if you have any other emergent concerns.  Additional Information:  Your vital signs today were: BP 109/64 (BP Location: Right Arm)    Pulse 73    Temp 97.8 F (36.6 C) (Oral)    Resp 18    Ht 5\' 3"  (1.6 m)    Wt 72.6 kg    SpO2 (!) 72%    BMI 28.34 kg/m  If your blood pressure (BP) was elevated above 135/85 this visit, please have this repeated by your doctor within one month. --------------

## 2017-12-05 ENCOUNTER — Encounter: Payer: Self-pay | Admitting: Neurology

## 2018-01-24 ENCOUNTER — Encounter: Payer: Self-pay | Admitting: Neurology

## 2018-01-24 ENCOUNTER — Encounter

## 2018-01-24 ENCOUNTER — Telehealth: Payer: Self-pay | Admitting: Neurology

## 2018-01-24 ENCOUNTER — Ambulatory Visit (INDEPENDENT_AMBULATORY_CARE_PROVIDER_SITE_OTHER): Payer: BLUE CROSS/BLUE SHIELD | Admitting: Neurology

## 2018-01-24 VITALS — BP 120/84 | HR 65 | Ht 63.0 in | Wt 171.0 lb

## 2018-01-24 DIAGNOSIS — R200 Anesthesia of skin: Secondary | ICD-10-CM | POA: Diagnosis not present

## 2018-01-24 DIAGNOSIS — R202 Paresthesia of skin: Secondary | ICD-10-CM

## 2018-01-24 DIAGNOSIS — R51 Headache: Secondary | ICD-10-CM | POA: Diagnosis not present

## 2018-01-24 DIAGNOSIS — G500 Trigeminal neuralgia: Secondary | ICD-10-CM

## 2018-01-24 DIAGNOSIS — R519 Headache, unspecified: Secondary | ICD-10-CM

## 2018-01-24 NOTE — Telephone Encounter (Signed)
[employer] pending faxed clinical notes  °

## 2018-01-24 NOTE — Progress Notes (Signed)
[facility]    Provider:  Dr Lucia Gaskins Referring Provider: ED Primary Care Physician:  Sandford Craze, NP  CC:  Facial pain  HPI:  [name] is a 62 y.o. female here as requested by Dr. Peggyann Juba for facial pain in the setting of swelling. Started with a numbness on the right side of her face. Then she wok eup one morning with excruciating pain and pain before her eye and then from the ear radiating down the jaw. No inciting events, no dental work, she started having some neck pain but otherwise nothing to incite the event. No rash. Since August the pain is continuous. She did not take the Neurontin she was prescribed. Been taking ibuprofen which helps a little. She gets shooting pains into the jaw, brief and severe, if she lays on that side it can trigger the pain. She had swelling on the right side.  Reviewed notes, labs and imaging from outside physicians, which showed:  Reviewed emergency room notes: Patient presented to the emergency room with 2 days of right-sided facial pain, prior to that numbness.  Intermittent sharp stabbing pains in her right forehead in the area of her right TMJ.  These pains can radiate to the remainder of her face.  She reports having significant swelling worse the day before.  Denied any dental issues and just had a normal checkup.  No facial weakness or facial droop.  No improvement with ibuprofen.  No other associated symptoms.  Remote history of headaches.  This visit was 12/04/2017.  Reviewed labs which included BUN 24 and creatinine 1.15.  Broad differential included TMJ, temporal arteritis, facial or trigeminal nerve irritation, dental etiology.  She was treated with NSAIDs and gabapentin.  Hx sed rate which was 11.    Review of Systems: Patient complains of symptoms per HPI as well as the following symptoms: neck pain. Pertinent negatives and positives per HPI. All others negative.   Social History   Socioeconomic History  .  Marital status: Divorced    Spouse name: Not on file  . Number of children: 1  . Years of education: Not on file  . Highest education level: Bachelor's degree (e.g., BA, AB, BS)  Occupational History  . Not on file  Social Needs  . Financial resource strain: Not on file  . Food insecurity:    Worry: Not on file    Inability: Not on file  . Transportation needs:    Medical: Not on file    Non-medical: Not on file  Tobacco Use  . Smoking status: Former Smoker    Packs/day: 0.25    Years: 30.00    Pack years: 7.50    Types: Cigarettes    Start date: 1975    Last attempt to quit: 2005    Years since quitting: 14.7  . Smokeless tobacco: Never Used  . Tobacco comment: off and on, not consistent   Substance and Sexual Activity  . Alcohol use: Not Currently    Comment: was drinking beer   . Drug use: Not Currently    Comment: marijuana smoked in the past   . Sexual activity: Not on file  Lifestyle  . Physical activity:    Days per week: Not on file    Minutes per session: Not on file  . Stress: Not on file  Relationships  . Social connections:    Talks on phone: Not on file    Gets together: Not on file    Attends religious  service: Not on file    Active member of club or organization: Not on file    Attends meetings of clubs or organizations: Not on file    Relationship status: Not on file  . Intimate partner violence:    Fear of current or ex partner: Not on file    Emotionally abused: Not on file    Physically abused: Not on file    Forced sexual activity: Not on file  Other Topics Concern  . Not on file  Social History Narrative   Lives at home alone   Right handed   Caffeine: maybe 2 cups on the weekend    Family History  Problem Relation Age of Onset  . Heart disease Mother   . Diabetes Father   . Heart disease Father   . Kidney cancer Father   . Diabetes Sister   . Heart disease Sister   . Heart disease Brother   . Heart disease Sister   . Stroke  Brother        hemorrhagic   . Autoimmune disease Neg Hx     Past Medical History:  Diagnosis Date  . High cholesterol     Past Surgical History:  Procedure Laterality Date  . BREAST BIOPSY Left 2017   stereotactic biopsy, benign  . BUNIONECTOMY    . CERVICAL SPINE SURGERY    . KNEE SURGERY Left   . rotator cuff surgery Right     Current Outpatient Medications  Medication Sig Dispense Refill  . naproxen (NAPROSYN) 500 MG tablet Take 1 tablet (500 mg total) by mouth 2 (two) times daily. 20 tablet 0  . pantoprazole (PROTONIX) 40 MG tablet Take 40 mg by mouth daily.    Marland Kitchen venlafaxine XR (EFFEXOR-XR) 150 MG 24 hr capsule Take 150 mg by mouth daily with breakfast.     No current facility-administered medications for this visit.     Allergies as of 01/24/2018 - Review Complete 01/24/2018  Allergen Reaction Noted  . Latex Hives 04/02/2014  . Hydromorphone hcl  01/19/2010  . Oxycodone-acetaminophen Nausea Only 01/08/2015    Vitals: BP 120/84 (BP Location: Left Arm, Patient Position: Sitting)   Pulse 65   Ht 5\' 3"  (1.6 m)   Wt 171 lb (77.6 kg)   BMI 30.29 kg/m  Last Weight:  Wt Readings from Last 1 Encounters:  01/24/18 171 lb (77.6 kg)   Last Height:   Ht Readings from Last 1 Encounters:  01/24/18 5\' 3"  (1.6 m)     Physical exam: Exam: Gen: NAD, conversant, well nourised, obese, well groomed                     CV: RRR, no MRG. No Carotid Bruits. No peripheral edema, warm, nontender Eyes: Conjunctivae clear without exudates or hemorrhage MSK: No swelling. She does have pain to palpation at the anterior tragus.   Neuro: Detailed Neurologic Exam  Speech:    Speech is normal; fluent and spontaneous with normal comprehension.  Cognition:    The patient is oriented to person, place, and time;     recent and remote memory intact;     language fluent;     normal attention, concentration,     fund of knowledge Cranial Nerves:    The pupils are equal, round, and  reactive to light. Attempted fundoscopic exam could not visualize.  Visual fields are full to finger confrontation. Extraocular movements are intact. Trigeminal sensation is intact and the muscles of mastication  are normal. The face is symmetric. The palate elevates in the midline. Hearing intact. Voice is normal. Shoulder shrug is normal. The tongue has normal motion without fasciculations.   Coordination:    Normal finger to nose and heel to shin. Normal rapid alternating movements.   Gait:    Heel-toe and tandem gait are normal.   Motor Observation:    No asymmetry, no atrophy, and no involuntary movements noted. Tone:    Normal muscle tone.    Posture:    Posture is normal. normal erect    Strength:    Strength is V/V in the upper and lower limbs.      Sensation: intact to LT     Reflex Exam:  DTR's: Trace AJs otherwise deep tendon reflexes in the upper and lower extremities are normal bilaterally.   Toes:    Right toe downgoing, left up?   Clonus:    Clonus is absent.     Assessment/Plan:  62 year old with new onset Trigeminal neuralgia. Cannot rule out other causes such as dental or TMJ, advised to follow up with dentist and cp.  Need to rule out masses, MS, compression of the trigem nerve by vascular loop with MRI. Also order labs as below.  - check labs today - MRI face/head with trigeminal protocol - Continue Gabapentin prn - Will call after follow up to discuss imaging and next steps: if idiopathic, may be referral to Dr. Ollen Bowl for radiofrequency ablation as patient does not want to be on long term medication  Orders Placed This Encounter  Procedures  . MR FACE/TRIGEMINAL WO/W CM  . B. burgdorfi Antibody  . Angiotensin converting enzyme  . Sedimentation rate  . Sjogren's syndrome antibods(ssa + ssb)  . C-reactive protein  . Basic Metabolic Panel  . ANA  . ANA, IFA (with reflex)        Naomie Dean, MD  [facility] [address] [place], [place] [address]  Phone [phone] Fax [phone]

## 2018-01-24 NOTE — Patient Instructions (Signed)
Trigeminal Neuralgia Trigeminal neuralgia is a nerve disorder that causes attacks of severe facial pain. The attacks last from a few seconds to several minutes. They can happen for days, weeks, or months and then go away for months or years. Trigeminal neuralgia is also called tic douloureux. What are the causes? This condition is caused by damage to a nerve in the face that is called the trigeminal nerve. An attack can be triggered by:  Talking.  Chewing.  Putting on makeup.  Washing your face.  Shaving your face.  Brushing your teeth.  Touching your face.  What increases the risk? This condition is more likely to develop in:  Women.  People who are 50 years of age or older.  What are the signs or symptoms? The main symptom of this condition is pain in the jaw, lips, eyes, nose, scalp, forehead, and face. The pain may be intense, stabbing, electric, or shock-like. How is this diagnosed? This condition is diagnosed with a physical exam. A CT scan or MRI may be done to rule out other conditions that can cause facial pain. How is this treated? This condition may be treated with:  Avoiding the things that trigger your attacks.  Pain medicine.  Surgery. This may be done in severe cases if other medical treatment does not provide relief.  Follow these instructions at home:  Take over-the-counter and prescription medicines only as told by your health care provider.  If you wish to get pregnant, talk with your health care provider before you start trying to get pregnant.  Avoid the things that trigger your attacks. It may help to: ? Chew on the unaffected side of your mouth. ? Avoid touching your face. ? Avoid blasts of hot or cold air. Contact a health care provider if:  Your pain medicine is not helping.  You develop new, unexplained symptoms, such as: ? Double vision. ? Facial weakness. ? Changes in hearing or balance.  You become pregnant. Get help right away  if:  Your pain is unbearable, and your pain medicine does not help. This information is not intended to replace advice given to you by your health care provider. Make sure you discuss any questions you have with your health care provider. Document Released: 04/02/2000 Document Revised: 12/07/2015 Document Reviewed: 07/29/2014 Elsevier Interactive Patient Education  2018 Elsevier Inc.  

## 2018-01-25 NOTE — Telephone Encounter (Signed)
I called [employer]  and spoke to nurse Renea Ee to go over this case. She is not able to approve this exam on her level because she stated for Trigeminal it is under either a MRA Head or a CT Angio Head.. You could switch the order to one of those exams or you could do a peer to peer for the MR Face/Trigemina wo/w  The phone number for the peer to peer is [phone] and the member ID is [id number] & DOB is 1956-01-22. The case does close tomorrow 01/26/18 at 4:00 pm.

## 2018-01-26 ENCOUNTER — Other Ambulatory Visit: Payer: Self-pay | Admitting: Neurology

## 2018-01-26 DIAGNOSIS — G500 Trigeminal neuralgia: Secondary | ICD-10-CM

## 2018-01-26 LAB — BASIC METABOLIC PANEL
BUN/Creatinine Ratio: 22 (ref 12–28)
BUN: 17 mg/dL (ref 8–27)
CALCIUM: 9.7 mg/dL (ref 8.7–10.3)
CO2: 24 mmol/L (ref 20–29)
CREATININE: 0.78 mg/dL (ref 0.57–1.00)
Chloride: 102 mmol/L (ref 96–106)
GFR, EST AFRICAN AMERICAN: 96 mL/min/{1.73_m2} (ref >59)
GFR, EST NON AFRICAN AMERICAN: 83 mL/min/{1.73_m2} (ref >59)
Glucose: 97 mg/dL (ref 65–99)
Potassium: 4.3 mmol/L (ref 3.5–5.2)
SODIUM: 141 mmol/L (ref 134–144)

## 2018-01-26 LAB — B. BURGDORFI ANTIBODIES

## 2018-01-26 LAB — ANA: ANA Titer 1: NEGATIVE

## 2018-01-26 LAB — C-REACTIVE PROTEIN: CRP: 6 mg/L (ref 0–10)

## 2018-01-26 LAB — SJOGREN'S SYNDROME ANTIBODS(SSA + SSB): ENA SSB (LA) Ab: <0.2 AI (ref 0.0–0.9)

## 2018-01-26 LAB — ANGIOTENSIN CONVERTING ENZYME: ANGIO CONVERT ENZYME: 44 U/L (ref 14–82)

## 2018-01-26 LAB — SEDIMENTATION RATE: Sed Rate: 25 mm/hr (ref 0–40)

## 2018-01-26 MED ORDER — ALPRAZOLAM 0.25 MG PO TABS
ORAL_TABLET | ORAL | 0 refills | Status: DC
Start: 2018-01-26 — End: 2021-11-26

## 2018-01-26 NOTE — Telephone Encounter (Signed)
Patient is scheduled at [facility] for 02/15/18 to have her MRI done.  She informed me she would like something because she is claustrophobic.

## 2018-01-26 NOTE — Telephone Encounter (Signed)
Done. thanks

## 2018-01-26 NOTE — Telephone Encounter (Signed)
Sent in to pharmacy thanks

## 2018-01-26 NOTE — Telephone Encounter (Signed)
They had to change it to MRI brain(they don't have a trigeminal protocol in their list of available tests) but hopefully we can also do the trigeminal protocol on this patient as well. Please inform the techs to add the trigeminal protocol along with the MRI of the brain. This was changed on insurance side already, [id number] approval.

## 2018-01-26 NOTE — Telephone Encounter (Signed)
Thank you :)

## 2018-01-26 NOTE — Telephone Encounter (Signed)
Noted thank you

## 2018-01-26 NOTE — Telephone Encounter (Signed)
When you get a chance can you put an MRI brain order in?

## 2018-02-15 ENCOUNTER — Ambulatory Visit: Payer: BLUE CROSS/BLUE SHIELD

## 2018-02-15 DIAGNOSIS — G500 Trigeminal neuralgia: Secondary | ICD-10-CM | POA: Diagnosis not present

## 2018-02-15 MED ORDER — GADOBENATE DIMEGLUMINE 529 MG/ML IV SOLN
15.0000 mL | Freq: Once | INTRAVENOUS | Status: AC | PRN
  Administered 2018-02-15: 15 mL via INTRAVENOUS

## 2018-03-06 ENCOUNTER — Ambulatory Visit: Payer: BLUE CROSS/BLUE SHIELD | Admitting: Neurology

## 2019-07-05 ENCOUNTER — Ambulatory Visit: Payer: Self-pay | Attending: Internal Medicine

## 2019-07-05 DIAGNOSIS — Z23 Encounter for immunization: Secondary | ICD-10-CM

## 2019-07-05 NOTE — Progress Notes (Signed)
   Covid-19 Vaccination Clinic  Name:  VENETIA PREWITT    MRN: [id number] DOB: 1955-06-01  07/05/2019  Ms. Brandel was observed post Covid-19 immunization for 15 minutes without incident. She was provided with Vaccine Information Sheet and instruction to access the V-Safe system.   Ms. Sleeper was instructed to call 911 with any severe reactions post vaccine: Marland Kitchen Difficulty breathing  . Swelling of face and throat  . A fast heartbeat  . A bad rash all over body  . Dizziness and weakness   Immunizations Administered    Name Date Dose VIS Date Route   Pfizer COVID-19 Vaccine 07/05/2019 10:54 AM 0.3 mL 03/30/2019 Intramuscular   Manufacturer: [employer], [employer]   Lot: [id number]   NDC: [address]-4

## 2019-07-31 ENCOUNTER — Ambulatory Visit: Payer: Self-pay | Attending: Internal Medicine

## 2019-07-31 ENCOUNTER — Ambulatory Visit: Payer: Self-pay

## 2019-07-31 DIAGNOSIS — Z23 Encounter for immunization: Secondary | ICD-10-CM

## 2019-07-31 NOTE — Progress Notes (Signed)
   Covid-19 Vaccination Clinic  Name:  [name]    MRN: [id number] DOB: Jul 16, 1955  07/31/2019  Ms. [name] was observed post Covid-19 immunization for 15 minutes without incident. She was provided with Vaccine Information Sheet and instruction to access the V-Safe system.   Ms. [name] was instructed to call 911 with any severe reactions post vaccine: Marland Kitchen Difficulty breathing  . Swelling of face and throat  . A fast heartbeat  . A bad rash all over body  . Dizziness and weakness   Immunizations Administered    Name Date Dose VIS Date Route   Pfizer COVID-19 Vaccine 07/31/2019  3:39 PM 0.3 mL 03/30/2019 Intramuscular   Manufacturer: [employer], [employer]   Lot: [id number]   NDC: [address]-2

## 2019-08-01 ENCOUNTER — Ambulatory Visit: Payer: Self-pay

## 2021-11-20 ENCOUNTER — Encounter (HOSPITAL_COMMUNITY): Payer: Self-pay

## 2021-11-20 ENCOUNTER — Other Ambulatory Visit: Payer: Self-pay

## 2021-11-20 ENCOUNTER — Emergency Department (HOSPITAL_BASED_OUTPATIENT_CLINIC_OR_DEPARTMENT_OTHER): Payer: BC Managed Care – PPO

## 2021-11-20 ENCOUNTER — Encounter (HOSPITAL_BASED_OUTPATIENT_CLINIC_OR_DEPARTMENT_OTHER): Payer: Self-pay | Admitting: Emergency Medicine

## 2021-11-20 ENCOUNTER — Inpatient Hospital Stay (HOSPITAL_BASED_OUTPATIENT_CLINIC_OR_DEPARTMENT_OTHER)
Admission: EM | Admit: 2021-11-20 | Discharge: 2021-11-26 | DRG: 871 | Disposition: A | Discharge status: Home / Self Care | Payer: BC Managed Care – PPO | Attending: Internal Medicine | Admitting: Internal Medicine

## 2021-11-20 DIAGNOSIS — R062 Wheezing: Secondary | ICD-10-CM

## 2021-11-20 DIAGNOSIS — N390 Urinary tract infection, site not specified: Secondary | ICD-10-CM | POA: Diagnosis not present

## 2021-11-20 DIAGNOSIS — N12 Tubulo-interstitial nephritis, not specified as acute or chronic: Secondary | ICD-10-CM | POA: Diagnosis present

## 2021-11-20 DIAGNOSIS — R7881 Bacteremia: Secondary | ICD-10-CM

## 2021-11-20 DIAGNOSIS — R0609 Other forms of dyspnea: Secondary | ICD-10-CM | POA: Diagnosis not present

## 2021-11-20 DIAGNOSIS — Z634 Disappearance and death of family member: Secondary | ICD-10-CM | POA: Diagnosis not present

## 2021-11-20 DIAGNOSIS — E669 Obesity, unspecified: Secondary | ICD-10-CM

## 2021-11-20 DIAGNOSIS — N10 Acute tubulo-interstitial nephritis: Secondary | ICD-10-CM | POA: Diagnosis present

## 2021-11-20 DIAGNOSIS — A419 Sepsis, unspecified organism: Secondary | ICD-10-CM | POA: Diagnosis not present

## 2021-11-20 DIAGNOSIS — A4151 Sepsis due to Escherichia coli [E. coli]: Principal | ICD-10-CM | POA: Diagnosis present

## 2021-11-20 DIAGNOSIS — F32A Depression, unspecified: Secondary | ICD-10-CM | POA: Diagnosis present

## 2021-11-20 DIAGNOSIS — B9620 Unspecified Escherichia coli [E. coli] as the cause of diseases classified elsewhere: Secondary | ICD-10-CM

## 2021-11-20 DIAGNOSIS — R030 Elevated blood-pressure reading, without diagnosis of hypertension: Secondary | ICD-10-CM | POA: Diagnosis not present

## 2021-11-20 DIAGNOSIS — R6520 Severe sepsis without septic shock: Secondary | ICD-10-CM | POA: Diagnosis not present

## 2021-11-20 DIAGNOSIS — G4700 Insomnia, unspecified: Secondary | ICD-10-CM

## 2021-11-20 DIAGNOSIS — Z79899 Other long term (current) drug therapy: Secondary | ICD-10-CM | POA: Diagnosis not present

## 2021-11-20 DIAGNOSIS — Z885 Allergy status to narcotic agent status: Secondary | ICD-10-CM

## 2021-11-20 DIAGNOSIS — Z6827 Body mass index (BMI) 27.0-27.9, adult: Secondary | ICD-10-CM

## 2021-11-20 DIAGNOSIS — Z87891 Personal history of nicotine dependence: Secondary | ICD-10-CM | POA: Diagnosis not present

## 2021-11-20 DIAGNOSIS — N179 Acute kidney failure, unspecified: Secondary | ICD-10-CM | POA: Diagnosis present

## 2021-11-20 DIAGNOSIS — E876 Hypokalemia: Secondary | ICD-10-CM | POA: Diagnosis not present

## 2021-11-20 DIAGNOSIS — K219 Gastro-esophageal reflux disease without esophagitis: Secondary | ICD-10-CM | POA: Diagnosis present

## 2021-11-20 DIAGNOSIS — J9601 Acute respiratory failure with hypoxia: Secondary | ICD-10-CM | POA: Diagnosis present

## 2021-11-20 DIAGNOSIS — R627 Adult failure to thrive: Secondary | ICD-10-CM | POA: Diagnosis present

## 2021-11-20 DIAGNOSIS — R6521 Severe sepsis with septic shock: Secondary | ICD-10-CM | POA: Diagnosis present

## 2021-11-20 DIAGNOSIS — I959 Hypotension, unspecified: Secondary | ICD-10-CM | POA: Diagnosis not present

## 2021-11-20 DIAGNOSIS — Z91040 Latex allergy status: Secondary | ICD-10-CM | POA: Diagnosis not present

## 2021-11-20 DIAGNOSIS — G8929 Other chronic pain: Secondary | ICD-10-CM | POA: Diagnosis present

## 2021-11-20 DIAGNOSIS — E8770 Fluid overload, unspecified: Secondary | ICD-10-CM

## 2021-11-20 DIAGNOSIS — M545 Low back pain, unspecified: Secondary | ICD-10-CM | POA: Diagnosis present

## 2021-11-20 DIAGNOSIS — N170 Acute kidney failure with tubular necrosis: Secondary | ICD-10-CM | POA: Diagnosis not present

## 2021-11-20 LAB — CBC
HCT: 35.7 % — ABNORMAL LOW (ref 36.0–46.0)
HCT: 31.1 % — ABNORMAL LOW (ref 36.0–46.0)
Hemoglobin: 12.2 g/dL (ref 12.0–15.0)
Hemoglobin: 10.2 g/dL — ABNORMAL LOW (ref 12.0–15.0)
MCH: 32.4 pg (ref 26.0–34.0)
MCH: 32.7 pg (ref 26.0–34.0)
MCHC: 34.2 g/dL (ref 30.0–36.0)
MCHC: 32.8 g/dL (ref 30.0–36.0)
MCV: 94.9 fL (ref 80.0–100.0)
MCV: 99.7 fL (ref 80.0–100.0)
Platelets: 154 10*3/uL (ref 150–400)
Platelets: 138 10*3/uL — ABNORMAL LOW (ref 150–400)
RBC: 3.76 MIL/uL — ABNORMAL LOW (ref 3.87–5.11)
RBC: 3.12 MIL/uL — ABNORMAL LOW (ref 3.87–5.11)
RDW: 13.3 % (ref 11.5–15.5)
RDW: 13.7 % (ref 11.5–15.5)
WBC: 15.7 10*3/uL — ABNORMAL HIGH (ref 4.0–10.5)
WBC: 14.3 10*3/uL — ABNORMAL HIGH (ref 4.0–10.5)
nRBC: 0 % (ref 0.0–0.2)
nRBC: 0 % (ref 0.0–0.2)

## 2021-11-20 LAB — BLOOD CULTURE ID PANEL (REFLEXED) - BCID2

## 2021-11-20 LAB — URINALYSIS, ROUTINE W REFLEX MICROSCOPIC
Glucose, UA: NEGATIVE mg/dL
Ketones, ur: NEGATIVE mg/dL
Leukocytes,Ua: NEGATIVE
Nitrite: NEGATIVE
Protein, ur: >=300 mg/dL — AB
Specific Gravity, Urine: 1.015 (ref 1.005–1.030)
pH: 6 (ref 5.0–8.0)

## 2021-11-20 LAB — COMPREHENSIVE METABOLIC PANEL
ALT: 92 U/L — ABNORMAL HIGH (ref 0–44)
AST: 52 U/L — ABNORMAL HIGH (ref 15–41)
Albumin: 2.8 g/dL — ABNORMAL LOW (ref 3.5–5.0)
Alkaline Phosphatase: 110 U/L (ref 38–126)
Anion gap: 10 (ref 5–15)
BUN: 16 mg/dL (ref 8–23)
CO2: 23 mmol/L (ref 22–32)
Calcium: 8.5 mg/dL — ABNORMAL LOW (ref 8.9–10.3)
Chloride: 98 mmol/L (ref 98–111)
Creatinine, Ser: 1.83 mg/dL — ABNORMAL HIGH (ref 0.44–1.00)
GFR, Estimated: 31 mL/min — ABNORMAL LOW (ref >60)
Glucose, Bld: 188 mg/dL — ABNORMAL HIGH (ref 70–99)
Potassium: 3.5 mmol/L (ref 3.5–5.1)
Sodium: 131 mmol/L — ABNORMAL LOW (ref 135–145)
Total Bilirubin: 1.9 mg/dL — ABNORMAL HIGH (ref 0.3–1.2)
Total Protein: 6.8 g/dL (ref 6.5–8.1)

## 2021-11-20 LAB — LACTIC ACID, PLASMA
Lactic Acid, Venous: 1.9 mmol/L (ref 0.5–1.9)
Lactic Acid, Venous: 2 mmol/L (ref 0.5–1.9)

## 2021-11-20 LAB — HIV ANTIBODY (ROUTINE TESTING W REFLEX): HIV Screen 4th Generation wRfx: NONREACTIVE

## 2021-11-20 LAB — URINALYSIS, MICROSCOPIC (REFLEX): WBC, UA: >50 WBC/hpf (ref 0–5)

## 2021-11-20 LAB — CREATININE, SERUM
Creatinine, Ser: 1.61 mg/dL — ABNORMAL HIGH (ref 0.44–1.00)
GFR, Estimated: 36 mL/min — ABNORMAL LOW (ref >60)

## 2021-11-20 LAB — LIPASE, BLOOD: Lipase: 22 U/L (ref 11–51)

## 2021-11-20 LAB — GLUCOSE, CAPILLARY: Glucose-Capillary: 149 mg/dL — ABNORMAL HIGH (ref 70–99)

## 2021-11-20 MED ORDER — SODIUM CHLORIDE 0.9 % IV SOLN
12.5000 mg | Freq: Four times a day (QID) | INTRAVENOUS | Status: DC | PRN
  Administered 2021-11-20 – 2021-11-23 (×7): 12.5 mg via INTRAVENOUS
  Filled 2021-11-20 (×3): qty 12.5
  Filled 2021-11-20: qty 0.5
  Filled 2021-11-20 (×4): qty 12.5

## 2021-11-20 MED ORDER — MORPHINE SULFATE (PF) 2 MG/ML IV SOLN: 2.0000 mg | INTRAVENOUS | Status: DC | PRN

## 2021-11-20 MED ORDER — SENNOSIDES-DOCUSATE SODIUM 8.6-50 MG PO TABS: 1.0000 | ORAL_TABLET | Freq: Every evening | ORAL | Status: DC | PRN

## 2021-11-20 MED ORDER — ONDANSETRON HCL 4 MG/2ML IJ SOLN
4.0000 mg | Freq: Four times a day (QID) | INTRAMUSCULAR | Status: DC | PRN
  Administered 2021-11-21 – 2021-11-23 (×3): 4 mg via INTRAVENOUS
  Filled 2021-11-20 (×4): qty 2

## 2021-11-20 MED ORDER — OXYCODONE HCL 5 MG PO TABS: 5.0000 mg | ORAL_TABLET | ORAL | Status: DC | PRN

## 2021-11-20 MED ORDER — ONDANSETRON HCL 4 MG PO TABS
4.0000 mg | ORAL_TABLET | Freq: Four times a day (QID) | ORAL | Status: DC | PRN
  Administered 2021-11-22: 4 mg via ORAL
  Filled 2021-11-20: qty 1

## 2021-11-20 MED ORDER — SODIUM CHLORIDE 0.9 % IV BOLUS
1000.0000 mL | Freq: Once | INTRAVENOUS | Status: AC
Start: 2021-11-20 — End: 2021-11-20
  Administered 2021-11-20: 1000 mL via INTRAVENOUS

## 2021-11-20 MED ORDER — LACTATED RINGERS IV SOLN: INTRAVENOUS | Status: AC

## 2021-11-20 MED ORDER — ONDANSETRON HCL 4 MG/2ML IJ SOLN
4.0000 mg | Freq: Once | INTRAMUSCULAR | Status: AC
  Administered 2021-11-20: 4 mg via INTRAVENOUS
  Filled 2021-11-20: qty 2

## 2021-11-20 MED ORDER — VENLAFAXINE HCL ER 150 MG PO CP24
150.0000 mg | ORAL_CAPSULE | Freq: Every day | ORAL | Status: DC
  Administered 2021-11-21 – 2021-11-26 (×6): 150 mg via ORAL
  Filled 2021-11-20 (×6): qty 1

## 2021-11-20 MED ORDER — CALCIUM CARBONATE ANTACID 500 MG PO CHEW: 1.0000 | CHEWABLE_TABLET | ORAL | Status: DC | PRN

## 2021-11-20 MED ORDER — OXYCODONE HCL 5 MG PO TABS
5.0000 mg | ORAL_TABLET | Freq: Four times a day (QID) | ORAL | Status: DC | PRN
  Administered 2021-11-20 – 2021-11-23 (×6): 5 mg via ORAL
  Filled 2021-11-20 (×6): qty 1

## 2021-11-20 MED ORDER — PNEUMOCOCCAL 20-VAL CONJ VACC 0.5 ML IM SUSY
0.5000 mL | PREFILLED_SYRINGE | INTRAMUSCULAR | Status: DC
  Filled 2021-11-20: qty 0.5

## 2021-11-20 MED ORDER — LACTATED RINGERS IV BOLUS (SEPSIS)
1000.0000 mL | Freq: Once | INTRAVENOUS | Status: AC
Start: 2021-11-20 — End: 2021-11-20
  Administered 2021-11-20: 1000 mL via INTRAVENOUS

## 2021-11-20 MED ORDER — LACTATED RINGERS IV BOLUS
500.0000 mL | Freq: Once | INTRAVENOUS | Status: AC
  Administered 2021-11-20: 500 mL via INTRAVENOUS

## 2021-11-20 MED ORDER — FENTANYL CITRATE PF 50 MCG/ML IJ SOSY
50.0000 ug | PREFILLED_SYRINGE | Freq: Once | INTRAMUSCULAR | Status: AC
  Administered 2021-11-20: 50 ug via INTRAVENOUS
  Filled 2021-11-20: qty 1

## 2021-11-20 MED ORDER — LACTATED RINGERS IV BOLUS (SEPSIS)
1000.0000 mL | Freq: Once | INTRAVENOUS | Status: AC
  Administered 2021-11-20: 1000 mL via INTRAVENOUS

## 2021-11-20 MED ORDER — SODIUM CHLORIDE 0.9 % IV SOLN
2.0000 g | INTRAVENOUS | Status: DC
  Administered 2021-11-20: 2 g via INTRAVENOUS
  Filled 2021-11-20: qty 20

## 2021-11-20 MED ORDER — BISACODYL 5 MG PO TBEC
5.0000 mg | DELAYED_RELEASE_TABLET | Freq: Every day | ORAL | Status: DC | PRN
  Administered 2021-11-21: 5 mg via ORAL
  Filled 2021-11-20: qty 1

## 2021-11-20 MED ORDER — ALBUMIN HUMAN 25 % IV SOLN
25.0000 g | Freq: Once | INTRAVENOUS | Status: AC
Start: 2021-11-20 — End: 2021-11-20
  Administered 2021-11-20: 25 g via INTRAVENOUS
  Filled 2021-11-20: qty 100

## 2021-11-20 MED ORDER — GUAIFENESIN 100 MG/5ML PO LIQD
5.0000 mL | ORAL | Status: DC | PRN
  Administered 2021-11-21 – 2021-11-22 (×2): 5 mL via ORAL
  Filled 2021-11-20 (×2): qty 10

## 2021-11-20 MED ORDER — LACTATED RINGERS IV BOLUS (SEPSIS)
250.0000 mL | Freq: Once | INTRAVENOUS | Status: AC
  Administered 2021-11-20: 250 mL via INTRAVENOUS

## 2021-11-20 MED ORDER — HEPARIN SODIUM (PORCINE) 5000 UNIT/ML IJ SOLN
5000.0000 [IU] | Freq: Three times a day (TID) | INTRAMUSCULAR | Status: DC
  Administered 2021-11-20 – 2021-11-26 (×18): 5000 [IU] via SUBCUTANEOUS
  Filled 2021-11-20 (×18): qty 1

## 2021-11-20 MED ORDER — HYDROMORPHONE HCL 1 MG/ML IJ SOLN
1.0000 mg | INTRAMUSCULAR | Status: DC | PRN
  Administered 2021-11-20 – 2021-11-23 (×5): 1 mg via INTRAVENOUS
  Filled 2021-11-20 (×5): qty 1

## 2021-11-20 MED ORDER — SODIUM CHLORIDE 0.9 % IV SOLN
2.0000 g | INTRAVENOUS | Status: DC
  Administered 2021-11-21 – 2021-11-23 (×3): 2 g via INTRAVENOUS
  Filled 2021-11-20 (×3): qty 20

## 2021-11-20 MED ORDER — ACETAMINOPHEN 650 MG RE SUPP: 650.0000 mg | Freq: Four times a day (QID) | RECTAL | Status: DC | PRN

## 2021-11-20 MED ORDER — SODIUM CHLORIDE 0.9 % IV SOLN: INTRAVENOUS | Status: DC

## 2021-11-20 MED ORDER — IPRATROPIUM-ALBUTEROL 0.5-2.5 (3) MG/3ML IN SOLN
3.0000 mL | RESPIRATORY_TRACT | Status: DC | PRN
  Administered 2021-11-21 – 2021-11-23 (×3): 3 mL via RESPIRATORY_TRACT
  Filled 2021-11-20 (×3): qty 3

## 2021-11-20 MED ORDER — PANTOPRAZOLE SODIUM 40 MG PO TBEC
40.0000 mg | DELAYED_RELEASE_TABLET | ORAL | Status: DC | PRN
  Administered 2021-11-21: 40 mg via ORAL
  Filled 2021-11-20: qty 1

## 2021-11-20 MED ORDER — ACETAMINOPHEN 325 MG PO TABS
650.0000 mg | ORAL_TABLET | Freq: Four times a day (QID) | ORAL | Status: DC | PRN
  Administered 2021-11-20 – 2021-11-21 (×4): 650 mg via ORAL
  Filled 2021-11-20 (×4): qty 2

## 2021-11-20 MED ORDER — DOCUSATE SODIUM 100 MG PO CAPS
100.0000 mg | ORAL_CAPSULE | Freq: Two times a day (BID) | ORAL | Status: DC
  Administered 2021-11-20 – 2021-11-26 (×9): 100 mg via ORAL
  Filled 2021-11-20 (×11): qty 1

## 2021-11-20 MED ORDER — METOPROLOL TARTRATE 5 MG/5ML IV SOLN: 5.0000 mg | INTRAVENOUS | Status: DC | PRN

## 2021-11-20 MED ORDER — LACTATED RINGERS IV BOLUS
1000.0000 mL | Freq: Once | INTRAVENOUS | Status: AC
  Administered 2021-11-20: 1000 mL via INTRAVENOUS

## 2021-11-20 MED ORDER — TRAZODONE HCL 100 MG PO TABS
100.0000 mg | ORAL_TABLET | Freq: Every day | ORAL | Status: DC
  Administered 2021-11-21 – 2021-11-25 (×5): 100 mg via ORAL
  Filled 2021-11-20: qty 1
  Filled 2021-11-20: qty 2
  Filled 2021-11-20 (×2): qty 1
  Filled 2021-11-20: qty 2

## 2021-11-20 MED ORDER — HYDRALAZINE HCL 20 MG/ML IJ SOLN: 10.0000 mg | INTRAMUSCULAR | Status: DC | PRN

## 2021-11-20 MED ORDER — PROMETHAZINE HCL 25 MG/ML IJ SOLN
INTRAMUSCULAR | Status: AC
  Administered 2021-11-20: 25 mg
  Filled 2021-11-20: qty 1

## 2021-11-20 NOTE — ED Triage Notes (Signed)
Pt c/o right sided abd pain radiating to right flank with vomiting x 2 days. Pt states pressure with urination.

## 2021-11-20 NOTE — Progress Notes (Signed)
PHARMACY - PHYSICIAN COMMUNICATION CRITICAL VALUE ALERT - BLOOD CULTURE IDENTIFICATION (BCID)  [name] is an 66 y.o. female who presented to [facility] on 11/20/2021 with sepsis due to UTI  Assessment:  BCID + for E. Coli (no resistance) in 4/4 bottles   Name of physician (or Provider) Contacted: Cliffton Asters, FNP  Current antibiotics: Ceftriaxone 2gm IV q24h  Changes to prescribed antibiotics recommended:  Patient is on recommended antibiotics - No changes needed  Results for orders placed or performed during the hospital encounter of 11/20/21  Blood Culture ID Panel (Reflexed) (Collected: 11/20/2021  7:20 AM)  Result Value Ref Range   Enterococcus faecalis NOT DETECTED NOT DETECTED   Enterococcus Faecium NOT DETECTED NOT DETECTED   Listeria monocytogenes NOT DETECTED NOT DETECTED   Staphylococcus species NOT DETECTED NOT DETECTED   Staphylococcus aureus (BCID) NOT DETECTED NOT DETECTED   Staphylococcus epidermidis NOT DETECTED NOT DETECTED   Staphylococcus lugdunensis NOT DETECTED NOT DETECTED   Streptococcus species NOT DETECTED NOT DETECTED   Streptococcus agalactiae NOT DETECTED NOT DETECTED   Streptococcus pneumoniae NOT DETECTED NOT DETECTED   Streptococcus pyogenes NOT DETECTED NOT DETECTED   A.calcoaceticus-baumannii NOT DETECTED NOT DETECTED   Bacteroides fragilis NOT DETECTED NOT DETECTED   Enterobacterales DETECTED (A) NOT DETECTED   Enterobacter cloacae complex NOT DETECTED NOT DETECTED   Escherichia coli DETECTED (A) NOT DETECTED   Klebsiella aerogenes NOT DETECTED NOT DETECTED   Klebsiella oxytoca NOT DETECTED NOT DETECTED   Klebsiella pneumoniae NOT DETECTED NOT DETECTED   Proteus species NOT DETECTED NOT DETECTED   Salmonella species NOT DETECTED NOT DETECTED   Serratia marcescens NOT DETECTED NOT DETECTED   Haemophilus influenzae NOT DETECTED NOT DETECTED   Neisseria meningitidis NOT DETECTED NOT DETECTED   Pseudomonas aeruginosa NOT DETECTED NOT DETECTED    Stenotrophomonas maltophilia NOT DETECTED NOT DETECTED   Candida albicans NOT DETECTED NOT DETECTED   Candida auris NOT DETECTED NOT DETECTED   Candida glabrata NOT DETECTED NOT DETECTED   Candida krusei NOT DETECTED NOT DETECTED   Candida parapsilosis NOT DETECTED NOT DETECTED   Candida tropicalis NOT DETECTED NOT DETECTED   Cryptococcus neoformans/gattii NOT DETECTED NOT DETECTED   CTX-M ESBL NOT DETECTED NOT DETECTED   Carbapenem resistance IMP NOT DETECTED NOT DETECTED   Carbapenem resistance KPC NOT DETECTED NOT DETECTED   Carbapenem resistance NDM NOT DETECTED NOT DETECTED   Carbapenem resist OXA 48 LIKE NOT DETECTED NOT DETECTED   Carbapenem resistance VIM NOT DETECTED NOT DETECTED    Maryellen Pile, PharmD 11/20/2021  11:19 PM

## 2021-11-20 NOTE — Progress Notes (Signed)
Date and time results received: 11/20/21 2126  Test: Lactic acid Critical Value: 2.0  Name of Provider Notified: Jon Billings, NP  Orders Received? Or Actions Taken?: Provider aware

## 2021-11-20 NOTE — Progress Notes (Signed)
[employer] Data - Nurse reported hypotension with MAP 61. Only NS infusing at 100  Action - LR bolus of 500 and instructed nurse to change fluids to previously ordered LR 150 Albumin 25% - 25gm as hypoalbuminemia even prior to fluid boluses today Hold trazodone and keep with oxy 5 mg every 6 for severe pain Repeat lactic acid  Response - MAP - now 67 Repeat lactic

## 2021-11-20 NOTE — Progress Notes (Signed)
   11/20/21 1656  Assess: MEWS Score  Temp 100 F (37.8 C)  BP (!) 111/90  MAP (mmHg) 99  Pulse Rate (!) 113  Resp 18  SpO2 91 %   Patient blood pressure increased after fluid bolus, patient now in yellow MEWS instead of previous red MEWS.

## 2021-11-20 NOTE — Sepsis Progress Note (Signed)
Sepsis protocol monitored by eLink 

## 2021-11-20 NOTE — ED Provider Notes (Signed)
[facility] EMERGENCY DEPARTMENT Provider Note   CSN: [id number] Arrival date & time: 11/20/21  2119     History  Chief Complaint  Patient presents with   Abdominal Pain    [name] is a 66 y.o. female.  HPI Patient reports started getting some right-sided flank pain and fullness 3 days ago.  This was uncomfortable and pressure-like.  Patient reports at onset she had a day where she got chills and sweats and weakness.  She did not have a monitor to measure her temperature.  For the next 2 days she was uncomfortable in the flank but pain then started to expand out and be more generalized through her upper abdomen with a lot of pressure.  Patient reports she has had several episodes of vomiting now.  She is felt increasingly weak and ill.  She has also started to have some discomfort with urination and suprapubic pressure with urination.  Patient reports occasional mild cough.  No pain with deep inspiration.  No lower extremity swelling or calf pain.  Patient reports a very distant history of having renal obstruction and requiring a percutaneous nephrostomy tube in about 2010.  No problems since that time.    Home Medications Prior to Admission medications   Medication Sig Start Date End Date Taking? Authorizing Provider  ALPRAZolam (XANAX) 0.25 MG tablet Take 1-2 tabs (0.25mg -0.50mg ) 30-60 minutes before procedure. May repeat if needed.Do not drive. 01/26/18   Anson Fret, MD  naproxen (NAPROSYN) 500 MG tablet Take 1 tablet (500 mg total) by mouth 2 (two) times daily. 12/04/17   Renne Crigler, PA-C  pantoprazole (PROTONIX) 40 MG tablet Take 40 mg by mouth daily.    [provider]  venlafaxine XR (EFFEXOR-XR) 150 MG 24 hr capsule Take 150 mg by mouth daily with breakfast.    [provider]      Allergies    Latex, Hydromorphone hcl, and Oxycodone-acetaminophen    Review of Systems   Review of Systems 10 systems reviewed negative except as per  HPI Physical Exam Updated Vital Signs BP 103/72   Pulse 71   Temp 99 F (37.2 C) (Oral)   Resp 18   Ht 5\' 3"  (1.6 m)   Wt 70.3 kg   SpO2 100%   BMI 27.46 kg/m  Physical Exam Constitutional:      Comments: Patient is mildly ill in appearance.  She is nontoxic.  Mental status clear.  No respiratory distress.  Uncomfortable in appearance.  HENT:     Head: Normocephalic and atraumatic.     Mouth/Throat:     Pharynx: Oropharynx is clear.  Eyes:     Extraocular Movements: Extraocular movements intact.  Cardiovascular:     Comments: Borderline tachycardia no rub murmur gallop Pulmonary:     Effort: Pulmonary effort is normal.     Breath sounds: Normal breath sounds.  Abdominal:     Comments: Very tender in right lateral quadrant and epigastrium.  No guarding.  Mild discomfort suprapubic palpation.  Abdomen is soft.  Positive for bilateral CVA tenderness to percussion  Musculoskeletal:        General: No swelling or tenderness. Normal range of motion.     Right lower leg: No edema.     Left lower leg: No edema.  Skin:    General: Skin is warm and dry.     Coloration: Skin is pale.  Neurological:     General: No focal deficit present.  Mental Status: She is oriented to person, place, and time.     Motor: No weakness.     Coordination: Coordination normal.     ED Results / Procedures / Treatments   Labs (all labs ordered are listed, but only abnormal results are displayed) Labs Reviewed  URINALYSIS, ROUTINE W REFLEX MICROSCOPIC - Abnormal; Notable for the following components:      Result Value   APPearance CLOUDY (*)    Hgb urine dipstick MODERATE (*)    Bilirubin Urine SMALL (*)    Protein, ur >=300 (*)    All other components within normal limits  COMPREHENSIVE METABOLIC PANEL - Abnormal; Notable for the following components:   Sodium 131 (*)    Glucose, Bld 188 (*)    Creatinine, Ser 1.83 (*)    Calcium 8.5 (*)    Albumin 2.8 (*)    AST 52 (*)    ALT 92 (*)     Total Bilirubin 1.9 (*)    GFR, Estimated 31 (*)    All other components within normal limits  CBC - Abnormal; Notable for the following components:   WBC 15.7 (*)    RBC 3.76 (*)    HCT 35.7 (*)    All other components within normal limits  URINALYSIS, MICROSCOPIC (REFLEX) - Abnormal; Notable for the following components:   Bacteria, UA MANY (*)    All other components within normal limits  CULTURE, BLOOD (ROUTINE X 2)  CULTURE, BLOOD (ROUTINE X 2)  URINE CULTURE  LIPASE, BLOOD  LACTIC ACID, PLASMA  LACTIC ACID, PLASMA    EKG None  Radiology DG Chest Port 1 View  Result Date: 11/20/2021 CLINICAL DATA:  cough flank pain EXAM: PORTABLE CHEST 1 VIEW COMPARISON:  None Available. FINDINGS: The heart size and mediastinal contours are within normal limits. Both lungs are clear without consolidation, pleural effusion or vascular congestion. The visualized skeletal structures are unremarkable. Status post ACDF changes in the visualized lower cervical spine. IMPRESSION: No active disease. Electronically Signed   By: Frazier Richards M.D.   On: 11/20/2021 09:08   CT Renal Stone Study  Result Date: 11/20/2021 CLINICAL DATA:  Flank pain, kidney stone suspected EXAM: CT ABDOMEN AND PELVIS WITHOUT CONTRAST TECHNIQUE: Multidetector CT imaging of the abdomen and pelvis was performed following the standard protocol without IV contrast. RADIATION DOSE REDUCTION: This exam was performed according to the departmental dose-optimization program which includes automated exposure control, adjustment of the mA and/or kV according to patient size and/or use of iterative reconstruction technique. COMPARISON:  CT 06/19/2008. FINDINGS: Lower chest: Bibasilar hypoventilatory changes. No acute abnormality. Hepatobiliary: No focal liver abnormality is seen. The gallbladder is unremarkable. Pancreas: Unremarkable. No pancreatic ductal dilatation or surrounding inflammatory changes. Spleen: Normal in size without focal  abnormality. Adrenals/Urinary Tract: Adrenal glands are unremarkable. Edematous appearance of the kidneys with perinephric and periureteral stranding bilaterally but no hydronephrosis or nephroureterolithiasis identified. No perinephric fluid collection identified. The bladder is mildly distended. Stomach/Bowel: The stomach is within normal limits. There is no evidence of bowel obstruction.The appendix is not definitively identified and is either decompressed or surgically absent. Vascular/Lymphatic: No significant vascular findings are present. No enlarged abdominal or pelvic lymph nodes. Reproductive: Small calcified in fibroids and subserosal/pedunculated fibroid posteriorly. Other: No abdominal wall hernia or abnormality. No abdominopelvic ascites. Musculoskeletal: No acute osseous abnormality. Bilateral hip arthroplasties with associated streak artifact. Degenerative disc disease with disc bulging, bilateral facet arthropathy, grade 1 anterolisthesis at L4-L5. IMPRESSION: Edematous appearance of the  kidneys with bilateral perinephric and periureteral stranding but no hydronephrosis or nephroureterolithiasis identified. Findings are concerning for bilateral pyelonephritis. Recommend correlation with urinalysis. Electronically Signed   By: Caprice Renshaw M.D.   On: 11/20/2021 09:08    Procedures Procedures   CRITICAL CARE Performed by: Arby Barrette   Total critical care time: 60 minutes  Critical care time was exclusive of separately billable procedures and treating other patients.  Critical care was necessary to treat or prevent imminent or life-threatening deterioration.  Critical care was time spent personally by me on the following activities: development of treatment plan with patient and/or surrogate as well as nursing, discussions with consultants, evaluation of patient's response to treatment, examination of patient, obtaining history from patient or surrogate, ordering and performing  treatments and interventions, ordering and review of laboratory studies, ordering and review of radiographic studies, pulse oximetry and re-evaluation of patient's condition.  Medications Ordered in ED Medications  cefTRIAXone (ROCEPHIN) 2 g in sodium chloride 0.9 % 100 mL IVPB (0 g Intravenous Stopped 11/20/21 0825)  lactated ringers infusion ( Intravenous New Bag/Given 11/20/21 0904)  lactated ringers bolus 1,000 mL (1,000 mLs Intravenous New Bag/Given 11/20/21 0831)    And  lactated ringers bolus 1,000 mL ( Intravenous Canceled Entry 11/20/21 0847)    And  lactated ringers bolus 250 mL (250 mLs Intravenous New Bag/Given 11/20/21 0903)  HYDROmorphone (DILAUDID) injection 1 mg (1 mg Intravenous Given 11/20/21 0942)  promethazine (PHENERGAN) 12.5 mg in sodium chloride 0.9 % 50 mL IVPB (has no administration in time range)  lactated ringers bolus 1,000 mL (1,000 mLs Intravenous New Bag/Given 11/20/21 0759)  ondansetron (ZOFRAN) injection 4 mg (4 mg Intravenous Given 11/20/21 0752)  fentaNYL (SUBLIMAZE) injection 50 mcg (50 mcg Intravenous Given 11/20/21 0754)  promethazine (PHENERGAN) 25 MG/ML injection (25 mg  Given 11/20/21 0941)    ED Course/ Medical Decision Making/ A&P                           Medical Decision Making Amount and/or Complexity of Data Reviewed Labs: ordered. Radiology: ordered.  Risk Prescription drug management. Decision regarding hospitalization.   Patient presents as outlined.  She is mildly ill in appearance and uncomfortable.  Patient has borderline tachycardia.  Blood pressures slightly soft while in the room observe blood pressure of 91 over 60s..  Differential diagnosis includes pyelonephritis\retained kidney stone\SIRS\sepsis\pancreatitis\cholecystitis\AAA\pneumonia\PE.  We will proceed with broad diagnostic evaluation.  At this time I have high suspicion for UTI with possibly retained kidney stone and early sepsis symptoms.  White count has resulted at 15,000.  Will initiate  lactated Ringer's fluid resuscitation, add blood culture and lactic acid, Rocephin 2 g IV for leukocytosis with strong UTI symptoms.  Will treat pain and nausea with Zofran and fentanyl.  CT scan examined and reviewed by myself as well as reviewed by radiology read.  Patient has bilateral stranding of the kidneys.  I do not appreciate any kidney stones within the ureters or renal pelvis.  Patient has had recurrence of nausea and required additional pain control.  Patient listed allergy to Dilaudid.  We reviewed this.  She reports back when she had her nephrostomy tubes (2013) she think she had some home Dilaudid to take.  She reports she started to have some types of confusion and hallucinations of music and sounds.  She reports she is really not sure that it was the Dilaudid at all and might of been another problem at  the time she was ill and being treated.  She is agreeable with trying Dilaudid for pain control again.  Consult: 10: 10 Dr. Darrick Meigs [facility] hospitalist for admission.  Patient has had 30 cc/kg fluid resuscitation.  Blood pressures are greater than 100.  Heart rate is under 100.  She is responding positively to treatment.  Patient has required some repeat control of nausea and pain.  Mental status remains clear.  Patient does not have respiratory distress.  Patient stable for admission        Final Clinical Impression(s) / ED Diagnoses Final diagnoses:  Pyelonephritis  Sepsis, due to unspecified organism, unspecified whether acute organ dysfunction present [facility])    Rx / DC Orders ED Discharge Orders     None         Charlesetta Shanks, MD 11/20/21 1014

## 2021-11-20 NOTE — H&P (Signed)
History and Physical    [name] ZOX:[id number] DOB: December 15, 1955 DOA: 11/20/2021  PCP: [name], PA Patient coming from: Home  Chief Complaint: Weakness  HPI: [name] is a 66 y.o. female with medical history significant of insomnia, depression, GERD, hot flashes, chronic lower back pain comes to the hospital with complaints of weakness, fevers and chills.  Unfortunately patient lost her sister about a week ago and after her funeral she has had poor oral intake for about 1 week thereafter about 3 days ago started experiencing generalized weakness, lower back pain slowly moving higher up along with subjective fevers and chills.  She denies any dysuria.  No other complaints. In the ED patient was found to have leukocytosis, UTI, acute kidney injury.  Baseline creatinine 0.9, admission creatinine 1.8.  CT of the abdomen pelvis showed bilateral pyelonephritis without any evidence of renal stones or hydronephrosis.  She was started on IV fluids and IV Rocephin empirically.  She was transferred from [facility] to [facility] hospital for further care and management.  CODE STATUS-full Social history-denies any illicit drug use.  Former smoker.  Drinks alcohol occasionally.   Review of Systems: As per HPI otherwise 10 point review of systems negative.  Review of Systems Otherwise negative except as per HPI, including: General: Denies night sweats or unintended weight loss. Resp: Denies cough, wheezing, shortness of breath. Cardiac: Denies chest pain, palpitations, orthopnea, paroxysmal nocturnal dyspnea. GI: Denies abdominal pain, nausea, vomiting, diarrhea or constipation GU: Denies dysuria, frequency, hesitancy or incontinence MS: Denies muscle aches, joint pain or swelling Neuro: Denies headache, neurologic deficits (focal weakness, numbness, tingling), abnormal gait Psych: Denies anxiety, depression, SI/HI/AVH Skin: Denies new rashes or lesions ID: Denies sick contacts,  exotic exposures, travel  Past Medical History:  Diagnosis Date   High cholesterol     Past Surgical History:  Procedure Laterality Date   BREAST BIOPSY Left 2017   stereotactic biopsy, benign   BUNIONECTOMY     CERVICAL SPINE SURGERY     KNEE SURGERY Left    rotator cuff surgery Right     SOCIAL HISTORY:  reports that she quit smoking about 18 years ago. Her smoking use included cigarettes. She started smoking about 48 years ago. She has a 7.50 pack-year smoking history. She has never used smokeless tobacco. She reports that she does not currently use alcohol. She reports that she does not currently use drugs.  Allergies  Allergen Reactions   Latex Hives   Hydromorphone Hcl Other (See Comments)     hallucinations   Oxycodone-Acetaminophen Nausea Only    FAMILY HISTORY: Family History  Problem Relation Age of Onset   Heart disease Mother    Diabetes Father    Heart disease Father    Kidney cancer Father    Diabetes Sister    Heart disease Sister    Heart disease Brother    Heart disease Sister    Stroke Brother        hemorrhagic    Autoimmune disease Neg Hx      Prior to Admission medications   Medication Sig Start Date End Date Taking? Authorizing Provider  acetaminophen (TYLENOL) 325 MG tablet Take 650 mg by mouth daily as needed (pain).   Yes [provider]  BIOTIN PO Take 2 tablets by mouth daily.   Yes [provider]  Calcium Carbonate Antacid (TUMS PO) Take 2 tablets by mouth as needed (acid reflux).   Yes [provider]  CALCIUM-VITAMINS C & D PO Take 3 tablets by mouth daily.   Yes [provider]  ibuprofen (ADVIL) 200 MG tablet Take 400 mg by mouth daily as needed (pain).   Yes [provider]  Multiple Vitamins-Minerals (MULTIVITAMIN WITH MINERALS) tablet Take 2 tablets by mouth daily.   Yes [provider]  pantoprazole (PROTONIX) 40 MG tablet Take 40 mg by mouth as needed (acid reflux).   Yes  [provider]  traZODone (DESYREL) 100 MG tablet Take 100 mg by mouth at bedtime. 10/23/21  Yes [provider]  venlafaxine XR (EFFEXOR-XR) 150 MG 24 hr capsule Take 150 mg by mouth daily with breakfast.   Yes [provider]  ALPRAZolam (XANAX) 0.25 MG tablet Take 1-2 tabs (0.25mg -0.50mg ) 30-60 minutes before procedure. May repeat if needed.Do not drive. Patient not taking: Reported on 11/20/2021 01/26/18   Melvenia Beam, MD  naproxen (NAPROSYN) 500 MG tablet Take 1 tablet (500 mg total) by mouth 2 (two) times daily. Patient not taking: Reported on 11/20/2021 12/04/17   Carlisle Cater, PA-C    Physical Exam: Vitals:   11/20/21 1230 11/20/21 1257 11/20/21 1401 11/20/21 1519  BP: 106/73  (!) 86/67 95/60  Pulse: (!) 102 100 (!) 103 (!) 114  Resp: 16  17 (!) 22  Temp: 98.5 F (36.9 C)  99.2 F (37.3 C) 100.2 F (37.9 C)  TempSrc: Oral  Oral Oral  SpO2: 100% 100% 100% 92%  Weight:      Height:          Constitutional: NAD, calm, comfortable Eyes: PERRL, lids and conjunctivae normal ENMT: Mucous membranes are moist. Posterior pharynx clear of any exudate or lesions.Normal dentition.  Neck: normal, supple, no masses, no thyromegaly Respiratory: clear to auscultation bilaterally, no wheezing, no crackles. Normal respiratory effort. No accessory muscle use.  Cardiovascular: Regular rate and rhythm, no murmurs / rubs / gallops. No extremity edema. 2+ pedal pulses. No carotid bruits.  Abdomen: no tenderness, no masses palpated. No hepatosplenomegaly. Bowel sounds positive.  Bilateral mild CVA tenderness Musculoskeletal: no clubbing / cyanosis. No joint deformity upper and lower extremities. Good ROM, no contractures. Normal muscle tone.  Skin: no rashes, lesions, ulcers. No induration Neurologic: CN 2-12 grossly intact. Sensation intact, DTR normal. Strength 5/5 in all 4.  Psychiatric: Normal judgment and insight. Alert and oriented x 3. Normal mood.     Labs  on Admission: I have personally reviewed following labs and imaging studies  CBC: Recent Labs  Lab 11/20/21 0720  WBC 15.7*  HGB 12.2  HCT 35.7*  MCV 94.9  PLT 123456   Basic Metabolic Panel: Recent Labs  Lab 11/20/21 0720  NA 131*  K 3.5  CL 98  CO2 23  GLUCOSE 188*  BUN 16  CREATININE 1.83*  CALCIUM 8.5*   GFR: Estimated Creatinine Clearance: 29.6 mL/min (A) (by C-G formula based on SCr of 1.83 mg/dL (H)). Liver Function Tests: Recent Labs  Lab 11/20/21 0720  AST 52*  ALT 92*  ALKPHOS 110  BILITOT 1.9*  PROT 6.8  ALBUMIN 2.8*   Recent Labs  Lab 11/20/21 0720  LIPASE 22   No results for input(s): "AMMONIA" in the last 168 hours. Coagulation Profile: No results for input(s): "INR", "PROTIME" in the last 168 hours. Cardiac Enzymes: No results for input(s): "CKTOTAL", "CKMB", "CKMBINDEX", "TROPONINI" in the last 168 hours. BNP (last 3 results) No results for input(s): "PROBNP" in the last 8760 hours. HbA1C: No results for input(s): "HGBA1C" in  the last 72 hours. CBG: No results for input(s): "GLUCAP" in the last 168 hours. Lipid Profile: No results for input(s): "CHOL", "HDL", "LDLCALC", "TRIG", "CHOLHDL", "LDLDIRECT" in the last 72 hours. Thyroid Function Tests: No results for input(s): "TSH", "T4TOTAL", "FREET4", "T3FREE", "THYROIDAB" in the last 72 hours. Anemia Panel: No results for input(s): "VITAMINB12", "FOLATE", "FERRITIN", "TIBC", "IRON", "RETICCTPCT" in the last 72 hours. Urine analysis:    Component Value Date/Time   COLORURINE YELLOW 11/20/2021 0719   APPEARANCEUR CLOUDY (A) 11/20/2021 0719   LABSPEC 1.015 11/20/2021 0719   PHURINE 6.0 11/20/2021 0719   GLUCOSEU NEGATIVE 11/20/2021 0719   HGBUR MODERATE (A) 11/20/2021 0719   BILIRUBINUR SMALL (A) 11/20/2021 0719   KETONESUR NEGATIVE 11/20/2021 0719   PROTEINUR >=300 (A) 11/20/2021 0719   UROBILINOGEN 0.2 07/09/2008 0750   NITRITE NEGATIVE 11/20/2021 0719   LEUKOCYTESUR NEGATIVE  11/20/2021 0719   Sepsis Labs: !!!!!!!!!!!!!!!!!!!!!!!!!!!!!!!!!!!!!!!!!!!! @LABRCNTIP (procalcitonin:4,lacticidven:4) ) Recent Results (from the past 240 hour(s))  Culture, blood (routine x 2)     Status: None (Preliminary result)   Collection Time: 11/20/21  7:20 AM   Specimen: BLOOD  Result Value Ref Range Status   Specimen Description   Final    BLOOD LEFT ANTECUBITAL Performed at [facility], [address] [place], [place] [address]    Special Requests   Final    BOTTLES DRAWN AEROBIC AND ANAEROBIC Blood Culture results may not be optimal due to an inadequate volume of blood received in culture bottles Performed at [facility], [address] [place], [place] [address]    Culture   Final    NO GROWTH <12 HOURS Performed at [facility], [place] [address], [place], [place] [address]    Report Status PENDING  Incomplete  Culture, blood (routine x 2)     Status: None (Preliminary result)   Collection Time: 11/20/21  7:50 AM   Specimen: BLOOD  Result Value Ref Range Status   Specimen Description   Final    BLOOD RIGHT ANTECUBITAL Performed at [facility], [address] [place], [place] [address]    Special Requests   Final    BOTTLES DRAWN AEROBIC AND ANAEROBIC Blood Culture results may not be optimal due to an inadequate volume of blood received in culture bottles Performed at [facility], [address] [place], [place] [address]    Culture   Final    NO GROWTH <12 HOURS Performed at [facility], [place] [address], [place], [place] [address]    Report Status PENDING  Incomplete     Radiological Exams on Admission: DG Chest Port 1 View  Result Date: 11/20/2021 CLINICAL DATA:  cough flank pain EXAM: PORTABLE CHEST 1 VIEW COMPARISON:  None Available. FINDINGS: The heart size and mediastinal contours are within normal limits. Both lungs are clear without consolidation, pleural effusion or vascular congestion. The  visualized skeletal structures are unremarkable. Status post ACDF changes in the visualized lower cervical spine. IMPRESSION: No active disease. Electronically Signed   By: Frazier Richards M.D.   On: 11/20/2021 09:08   CT Renal Stone Study  Result Date: 11/20/2021 CLINICAL DATA:  Flank pain, kidney stone suspected EXAM: CT ABDOMEN AND PELVIS WITHOUT CONTRAST TECHNIQUE: Multidetector CT imaging of the abdomen and pelvis was performed following the standard protocol without IV contrast. RADIATION DOSE REDUCTION: This exam was performed according to the departmental dose-optimization program which includes automated exposure control, adjustment  of the mA and/or kV according to patient size and/or use of iterative reconstruction technique. COMPARISON:  CT 06/19/2008. FINDINGS: Lower chest: Bibasilar hypoventilatory changes. No acute abnormality. Hepatobiliary: No focal liver abnormality is seen. The gallbladder is unremarkable. Pancreas: Unremarkable. No pancreatic ductal dilatation or surrounding inflammatory changes. Spleen: Normal in size without focal abnormality. Adrenals/Urinary Tract: Adrenal glands are unremarkable. Edematous appearance of the kidneys with perinephric and periureteral stranding bilaterally but no hydronephrosis or nephroureterolithiasis identified. No perinephric fluid collection identified. The bladder is mildly distended. Stomach/Bowel: The stomach is within normal limits. There is no evidence of bowel obstruction.The appendix is not definitively identified and is either decompressed or surgically absent. Vascular/Lymphatic: No significant vascular findings are present. No enlarged abdominal or pelvic lymph nodes. Reproductive: Small calcified in fibroids and subserosal/pedunculated fibroid posteriorly. Other: No abdominal wall hernia or abnormality. No abdominopelvic ascites. Musculoskeletal: No acute osseous abnormality. Bilateral hip arthroplasties with associated streak artifact.  Degenerative disc disease with disc bulging, bilateral facet arthropathy, grade 1 anterolisthesis at L4-L5. IMPRESSION: Edematous appearance of the kidneys with bilateral perinephric and periureteral stranding but no hydronephrosis or nephroureterolithiasis identified. Findings are concerning for bilateral pyelonephritis. Recommend correlation with urinalysis. Electronically Signed   By: Caprice Renshaw M.D.   On: 11/20/2021 09:08     All images have been reviewed by me personally.  EKG: Independently reviewed.   Assessment/Plan Principal Problem:   Sepsis secondary to UTI [facility]) Active Problems:   Pyelonephritis   AKI (acute kidney injury) (HCC)    Severe sepsis secondary to acute bilateral pyelonephritis Acute kidney injury, baseline 0.9.  Admission creatinine 1.8 -Sepsis protocol initiated, severe sepsis noted by leukocytosis, hypotension, source and AKI.  Getting aggressive IV fluids.  If remains hypotensive despite IV fluid boluses, patient may require to be transferred to stepdown unit or ICU for vasopressors. - Empiric IV Rocephin.  Follow-up culture data - CT abdomen pelvis reviewed-shows bilateral pyelonephritis, no evidence of renal stones/obstruction or hydronephrosis.  Insomnia -Continue home trazodone  GERD/depression -PPI as needed.  Continue daily effects or    DVT prophylaxis: Subcu heparin Code Status: Full code Family Communication: Daughter at bedside Consults called: None Admission status: Inpatient admission  Status is: Inpatient Remains inpatient appropriate because: Admit patient to the hospital secondary to sepsis from UTI and acute kidney injury   Time Spent: 65 minutes.  >50% of the time was devoted to discussing the patients care, assessment, plan and disposition with other care givers along with counseling the patient about the risks and benefits of treatment.    Shaleigh Laubscher Joline Maxcy MD [facility]  If 7PM-7AM, please contact  night-coverage   11/20/2021, 3:52 PM

## 2021-11-20 NOTE — ED Notes (Addendum)
Assumed patient care at this time. Patient states she is having right flank pain x 2 days and pressure during urination. Patient  denies fevers but states she is in pain.   Patient is a difficult IV start- states they can usually only go in her hand.   0845: Patient currently in CT at this time. Fluids continue to infuse. ABX completed. Awaiting results.   4628: Patient states her pain has gotten worse and now she is nauseated. MD made aware.   1005: Patient states "I'm finally not nauseated or in pain, I feel so much better" friend at bedside. Patient repositioned in bed, Patient attempting to sleep at this time.  1246: Patient continues to rest quietly at this time with daughter at bedside. Ready bed received at this time. Report called to [facility] RN at [facility]. Awaiting carelink at this time. Patient updated on plan of care.

## 2021-11-20 NOTE — Progress Notes (Signed)
   11/20/21 1519  Assess: MEWS Score  Temp 100.2 F (37.9 C)  BP 95/60  MAP (mmHg) 71  Pulse Rate (!) 114  Resp (!) 22  SpO2 92 %  O2 Device Nasal Cannula  O2 Flow Rate (L/min) 2 L/min  Assess: MEWS Score  MEWS Temp 0  MEWS Systolic 1  MEWS Pulse 2  MEWS RR 1  MEWS LOC 0  MEWS Score 4  MEWS Score Color Red  Assess: if the MEWS score is Yellow or Red  Were vital signs taken at a resting state? Yes  Focused Assessment No change from prior assessment  Does the patient meet 2 or more of the SIRS criteria? Yes  Does the patient have a confirmed or suspected source of infection? Yes  Provider and Rapid Response Notified? Yes  MEWS guidelines implemented *See Row Information* Yes  Take Vital Signs  Increase Vital Sign Frequency  Red: Q 1hr X 4 then Q 4hr X 4, if remains red, continue Q 4hrs  Escalate  MEWS: Escalate Red: discuss with charge nurse/RN and provider, consider discussing with RRT  Notify: Charge Nurse/RN  Name of Charge Nurse/RN Notified Shamieka Gullo  Date Charge Nurse/RN Notified 11/20/21  Time Charge Nurse/RN Notified 1521  Notify: Provider  Provider Name/Title Dr. Nelson Chimes  Date Provider Notified 11/20/21  Time Provider Notified 1524  Method of Notification Page (secure chat)  Notification Reason Other (Comment) (Red MEWS)  Provider response At bedside;See new orders  Date of Provider Response 11/20/21  Time of Provider Response 1526  Notify: Rapid Response  Name of Rapid Response RN Notified Debra  Date Rapid Response Notified 11/20/21  Time Rapid Response Notified 1529  Assess: SIRS CRITERIA  SIRS Temperature  0  SIRS Pulse 1  SIRS Respirations  1  SIRS WBC 1  SIRS Score Sum  3

## 2021-11-20 NOTE — ED Notes (Signed)
Carelink at bedside. Pt stable for transport. 

## 2021-11-21 ENCOUNTER — Inpatient Hospital Stay (HOSPITAL_COMMUNITY): Payer: BC Managed Care – PPO

## 2021-11-21 DIAGNOSIS — N390 Urinary tract infection, site not specified: Secondary | ICD-10-CM | POA: Diagnosis not present

## 2021-11-21 DIAGNOSIS — A419 Sepsis, unspecified organism: Secondary | ICD-10-CM | POA: Diagnosis not present

## 2021-11-21 LAB — BASIC METABOLIC PANEL
Anion gap: 9 (ref 5–15)
BUN: 23 mg/dL (ref 8–23)
CO2: 22 mmol/L (ref 22–32)
Calcium: 7.5 mg/dL — ABNORMAL LOW (ref 8.9–10.3)
Chloride: 105 mmol/L (ref 98–111)
Creatinine, Ser: 1.95 mg/dL — ABNORMAL HIGH (ref 0.44–1.00)
GFR, Estimated: 28 mL/min — ABNORMAL LOW (ref >60)
Glucose, Bld: 136 mg/dL — ABNORMAL HIGH (ref 70–99)
Potassium: 4.1 mmol/L (ref 3.5–5.1)
Sodium: 136 mmol/L (ref 135–145)

## 2021-11-21 LAB — CBC
HCT: 29.7 % — ABNORMAL LOW (ref 36.0–46.0)
Hemoglobin: 9.2 g/dL — ABNORMAL LOW (ref 12.0–15.0)
MCH: 32.3 pg (ref 26.0–34.0)
MCHC: 31 g/dL (ref 30.0–36.0)
MCV: 104.2 fL — ABNORMAL HIGH (ref 80.0–100.0)
Platelets: 126 10*3/uL — ABNORMAL LOW (ref 150–400)
RBC: 2.85 MIL/uL — ABNORMAL LOW (ref 3.87–5.11)
RDW: 14.2 % (ref 11.5–15.5)
WBC: 10.8 10*3/uL — ABNORMAL HIGH (ref 4.0–10.5)
nRBC: 0 % (ref 0.0–0.2)

## 2021-11-21 LAB — MRSA NEXT GEN BY PCR, NASAL: MRSA by PCR Next Gen: NOT DETECTED

## 2021-11-21 LAB — LACTIC ACID, PLASMA: Lactic Acid, Venous: 1.8 mmol/L (ref 0.5–1.9)

## 2021-11-21 LAB — MAGNESIUM: Magnesium: 1.4 mg/dL — ABNORMAL LOW (ref 1.7–2.4)

## 2021-11-21 MED ORDER — MIDODRINE HCL 5 MG PO TABS
5.0000 mg | ORAL_TABLET | Freq: Three times a day (TID) | ORAL | Status: DC
  Administered 2021-11-21 (×3): 5 mg via ORAL
  Filled 2021-11-21 (×4): qty 1

## 2021-11-21 MED ORDER — MAGNESIUM SULFATE 4 GM/100ML IV SOLN
4.0000 g | Freq: Once | INTRAVENOUS | Status: AC
  Administered 2021-11-21: 4 g via INTRAVENOUS
  Filled 2021-11-21: qty 100

## 2021-11-21 MED ORDER — SODIUM CHLORIDE 0.9 % IV BOLUS
500.0000 mL | Freq: Once | INTRAVENOUS | Status: AC
Start: 2021-11-21 — End: 2021-11-21
  Administered 2021-11-21: 500 mL via INTRAVENOUS

## 2021-11-21 MED ORDER — ALBUMIN HUMAN 25 % IV SOLN
25.0000 g | Freq: Four times a day (QID) | INTRAVENOUS | Status: AC
  Administered 2021-11-21 – 2021-11-22 (×3): 12.5 g via INTRAVENOUS
  Administered 2021-11-22: 25 g via INTRAVENOUS
  Filled 2021-11-21 (×2): qty 100

## 2021-11-21 MED ORDER — SODIUM CHLORIDE 0.9 % IV SOLN: INTRAVENOUS | Status: DC

## 2021-11-21 MED ORDER — MIDODRINE HCL 5 MG PO TABS
10.0000 mg | ORAL_TABLET | Freq: Three times a day (TID) | ORAL | Status: DC
  Administered 2021-11-22 (×3): 10 mg via ORAL
  Filled 2021-11-21 (×3): qty 2

## 2021-11-21 MED ORDER — MIDODRINE HCL 5 MG PO TABS
5.0000 mg | ORAL_TABLET | Freq: Once | ORAL | Status: AC
  Administered 2021-11-21: 5 mg via ORAL
  Filled 2021-11-21: qty 1

## 2021-11-21 MED ORDER — CHLORHEXIDINE GLUCONATE CLOTH 2 % EX PADS
6.0000 | MEDICATED_PAD | Freq: Every day | CUTANEOUS | Status: DC
  Administered 2021-11-22: 6 via TOPICAL

## 2021-11-21 MED ORDER — CALCIUM GLUCONATE-NACL 2-0.675 GM/100ML-% IV SOLN
2.0000 g | Freq: Once | INTRAVENOUS | Status: AC
  Administered 2021-11-21: 2000 mg via INTRAVENOUS
  Filled 2021-11-21: qty 100

## 2021-11-21 MED ORDER — CHLORHEXIDINE GLUCONATE CLOTH 2 % EX PADS: 6.0000 | MEDICATED_PAD | Freq: Every day | CUTANEOUS | Status: DC

## 2021-11-21 NOTE — Progress Notes (Signed)
   11/20/21 1907  Assess: MEWS Score  Temp 98.9 F (37.2 C)  BP (!) 77/52  MAP (mmHg) (!) 61  Pulse Rate 98  Resp 17  SpO2 93 %  Assess: MEWS Score  MEWS Temp 0  MEWS Systolic 2  MEWS Pulse 0  MEWS RR 0  MEWS LOC 0  MEWS Score 2  MEWS Score Color Yellow  Assess: if the MEWS score is Yellow or Red  Were vital signs taken at a resting state? Yes  Focused Assessment No change from prior assessment  Does the patient meet 2 or more of the SIRS criteria? Yes  Does the patient have a confirmed or suspected source of infection? Yes  Provider and Rapid Response Notified? Yes  MEWS guidelines implemented *See Row Information* No, previously red, continue vital signs every 4 hours  Treat  MEWS Interventions Administered scheduled meds/treatments  Take Vital Signs  Increase Vital Sign Frequency  Red: Q 1hr X 4 then Q 4hr X 4, if remains red, continue Q 4hrs  Escalate  MEWS: Escalate Red: discuss with charge nurse/RN and provider, consider discussing with RRT  Notify: Charge Nurse/RN  Name of Charge Nurse/RN Notified Judeth Cornfield  Date Charge Nurse/RN Notified 11/20/21  Time Charge Nurse/RN Notified 1907  Notify: Provider  Provider Name/Title Jon Billings, NP  Date Provider Notified 11/20/21  Time Provider Notified 1907  Method of Notification Page (secure chat)  Notification Reason Other (Comment) (BP 77/52)  Provider response See new orders  Date of Provider Response 11/20/21  Time of Provider Response 1908  Notify: Rapid Response  Name of Rapid Response RN Notified Janelle  Date Rapid Response Notified 11/20/21  Time Rapid Response Notified 1908  Assess: SIRS CRITERIA  SIRS Temperature  0  SIRS Pulse 1  SIRS Respirations  0  SIRS WBC 1  SIRS Score Sum  2

## 2021-11-21 NOTE — Progress Notes (Signed)
   11/21/21 0244  Assess: MEWS Score  Temp (!) 100.8 F (38.2 C)  BP (!) 89/62  MAP (mmHg) 72  Pulse Rate (!) 111  Resp 20  SpO2 91 %  O2 Device Nasal Cannula  Assess: MEWS Score  MEWS Temp 1  MEWS Systolic 1  MEWS Pulse 2  MEWS RR 0  MEWS LOC 0  MEWS Score 4  MEWS Score Color Red  Assess: if the MEWS score is Yellow or Red  Were vital signs taken at a resting state? Yes  Focused Assessment No change from prior assessment  Does the patient meet 2 or more of the SIRS criteria? Yes  Does the patient have a confirmed or suspected source of infection? Yes  Provider and Rapid Response Notified? Yes  MEWS guidelines implemented *See Row Information* Yes  Treat  MEWS Interventions Administered scheduled meds/treatments;Escalated (See documentation below)  Take Vital Signs  Increase Vital Sign Frequency  Red: Q 1hr X 4 then Q 4hr X 4, if remains red, continue Q 4hrs  Escalate  MEWS: Escalate Red: discuss with charge nurse/RN and provider, consider discussing with RRT  Notify: Charge Nurse/RN  Name of Charge Nurse/RN Notified Judeth Cornfield  Date Charge Nurse/RN Notified 11/21/21  Time Charge Nurse/RN Notified 0300  Notify: Provider  Provider Name/Title Jon Billings, NP  Date Provider Notified 11/21/21  Time Provider Notified [phone]  Method of Notification Page (Secure chat)  Notification Reason Other (Comment) (Red MEWS)  Provider response No new orders  Date of Provider Response 11/21/21  Time of Provider Response 0345  Assess: SIRS CRITERIA  SIRS Temperature  0  SIRS Pulse 1  SIRS Respirations  0  SIRS WBC 1  SIRS Score Sum  2

## 2021-11-21 NOTE — Plan of Care (Signed)
  Problem: Activity: Goal: Risk for activity intolerance will decrease Outcome: Progressing   Problem: Nutrition: Goal: Adequate nutrition will be maintained Outcome: Progressing   Problem: Coping: Goal: Level of anxiety will decrease Outcome: Progressing   Problem: Pain Managment: Goal: General experience of comfort will improve Outcome: Progressing   

## 2021-11-21 NOTE — Progress Notes (Signed)
Patient still soft soft BP. Foley placed due to poor urine output. Still has dark sediments. I am concerned about ATN due to acute infection and poor renal perfusion.  Will increase Midodrine to 10mg  po tid. Give albumin 25% x 3 doses, Cont IVF. We will add low dose levophed if needed. Can additionally add hydrocortisone if needed.   Discussed this with Dr from [place] as well. They will be available to take over if it becomes necessary or if ptn remains on prolonged pressors.   [place] MD

## 2021-11-21 NOTE — Progress Notes (Signed)
PROGRESS NOTE    Charlianne Melick Woodfin  [id number] DOB: 06-Jul-1955 DOA: 11/20/2021 PCP: Wynn Banker, PA   Brief Narrative:  66 y.o. female with medical history significant of insomnia, depression, GERD, hot flashes, chronic lower back pain comes to the hospital with complaints of weakness, fevers and chills.  Patient was admitted for sepsis secondary to UTI, AKI and bilateral pyelonephritis.   Assessment & Plan:  Principal Problem:   Sepsis secondary to UTI [facility]) Active Problems:   Pyelonephritis   AKI (acute kidney injury) ([place])      Severe sepsis secondary to acute bilateral pyelonephritis Acute kidney injury, baseline 0.9.  Admission creatinine 1.8 - WBC slowly improving but still has soft blood pressure requiring fluid boluses overnight.  This morning we will continue aggressive IV fluids, will add midodrine 5 mg 3 times daily.  Urine cultures/blood cultures-showing E. coli.  Continue IV Rocephin. Will transfer her to stepdown due to low BPs - CT abdomen pelvis reviewed-shows bilateral pyelonephritis, no evidence of renal stones/obstruction or hydronephrosis. -Creatinine still elevated at 1.95. Low threshold to broaden Abx.   Hypomagnesemia - Repletion   Insomnia -Continue home trazodone   GERD/depression -PPI as needed.  Continue daily effexor      DVT prophylaxis: Subcu heparin Code Status: Full code Family Communication:    Status is: Inpatient Remains inpatient appropriate because: Patient still slightly hypotensive and has acute kidney injury.  Getting IV fluids and IV antibiotics.  Maintain hospital stay.  Transfer patient to stepdown due to low BPs     Subjective: Febrile this morning needing tyleno. Overall feels weak Soft Bps.    Examination: Constitutional: Not in acute distress Respiratory: Clear to auscultation bilaterally Cardiovascular: Normal sinus rhythm, no rubs Abdomen: Nontender nondistended good bowel sounds Musculoskeletal:  No edema noted Skin: No rashes seen Neurologic: CN 2-12 grossly intact.  And nonfocal Psychiatric: Normal judgment and insight. Alert and oriented x 3. Normal mood.    Objective: Vitals:   11/21/21 0244 11/21/21 0344 11/21/21 0452 11/21/21 0552  BP: (!) 89/62 (!) 83/62 (!) 83/55 (!) 83/61  Pulse: (!) 111 94 91 95  Resp: 20 (!) 22 20 (!) 22  Temp: (!) 100.8 F (38.2 C) 99.8 F (37.7 C) 99.4 F (37.4 C) 98.9 F (37.2 C)  TempSrc: Oral Oral Oral Oral  SpO2: 91% 92% 93% 92%  Weight:      Height:        Intake/Output Summary (Last 24 hours) at 11/21/2021 0744 Last data filed at 11/21/2021 0439 Gross per 24 hour  Intake 7269.05 ml  Output 350 ml  Net 6919.05 ml   Filed Weights   11/20/21 0703  Weight: 70.3 kg     Data Reviewed:   CBC: Recent Labs  Lab 11/20/21 0720 11/20/21 1622 11/21/21 0546  WBC 15.7* 14.3* 10.8*  HGB 12.2 10.2* 9.2*  HCT 35.7* 31.1* 29.7*  MCV 94.9 99.7 104.2*  PLT 154 138* 123XX123*   Basic Metabolic Panel: Recent Labs  Lab 11/20/21 0720 11/20/21 1622 11/21/21 0546  NA 131*  --  136  K 3.5  --  4.1  CL 98  --  105  CO2 23  --  22  GLUCOSE 188*  --  136*  BUN 16  --  23  CREATININE 1.83* 1.61* 1.95*  CALCIUM 8.5*  --  7.5*  MG  --   --  1.4*   GFR: Estimated Creatinine Clearance: 27.8 mL/min (A) (by C-G formula based on SCr of 1.95 mg/dL (  H)). Liver Function Tests: Recent Labs  Lab 11/20/21 0720  AST 52*  ALT 92*  ALKPHOS 110  BILITOT 1.9*  PROT 6.8  ALBUMIN 2.8*   Recent Labs  Lab 11/20/21 0720  LIPASE 22   No results for input(s): "AMMONIA" in the last 168 hours. Coagulation Profile: No results for input(s): "INR", "PROTIME" in the last 168 hours. Cardiac Enzymes: No results for input(s): "CKTOTAL", "CKMB", "CKMBINDEX", "TROPONINI" in the last 168 hours. BNP (last 3 results) No results for input(s): "PROBNP" in the last 8760 hours. HbA1C: No results for input(s): "HGBA1C" in the last 72 hours. CBG: Recent Labs  Lab  11/20/21 2029  GLUCAP 149*   Lipid Profile: No results for input(s): "CHOL", "HDL", "LDLCALC", "TRIG", "CHOLHDL", "LDLDIRECT" in the last 72 hours. Thyroid Function Tests: No results for input(s): "TSH", "T4TOTAL", "FREET4", "T3FREE", "THYROIDAB" in the last 72 hours. Anemia Panel: No results for input(s): "VITAMINB12", "FOLATE", "FERRITIN", "TIBC", "IRON", "RETICCTPCT" in the last 72 hours. Sepsis Labs: Recent Labs  Lab 11/20/21 0750 11/20/21 2032  LATICACIDVEN 1.9 2.0*    Recent Results (from the past 240 hour(s))  Culture, blood (routine x 2)     Status: None (Preliminary result)   Collection Time: 11/20/21  7:20 AM   Specimen: BLOOD  Result Value Ref Range Status   Specimen Description   Final    BLOOD LEFT ANTECUBITAL Performed at [facility], [address] [place], [place] [address]    Special Requests   Final    BOTTLES DRAWN AEROBIC AND ANAEROBIC Blood Culture results may not be optimal due to an inadequate volume of blood received in culture bottles Performed at [facility], [address] [place], [place] [address]    Culture  Setup Time   Final    GRAM NEGATIVE RODS IN BOTH AEROBIC AND ANAEROBIC BOTTLES Organism ID to follow CRITICAL RESULT CALLED TO, READ BACK BY AND VERIFIED WITH: L POINDEXTER,[email or web address]  11/20/21 MK    Culture   Final    NO GROWTH <12 HOURS Performed at [facility], [address], [place], [place] [address]    Report Status PENDING  Incomplete  Blood Culture ID Panel (Reflexed)     Status: Abnormal   Collection Time: 11/20/21  7:20 AM  Result Value Ref Range Status   Enterococcus faecalis NOT DETECTED NOT DETECTED Final   Enterococcus Faecium NOT DETECTED NOT DETECTED Final   Listeria monocytogenes NOT DETECTED NOT DETECTED Final   Staphylococcus species NOT DETECTED NOT DETECTED Final   Staphylococcus aureus (BCID) NOT DETECTED NOT DETECTED Final   Staphylococcus epidermidis NOT DETECTED NOT DETECTED  Final   Staphylococcus lugdunensis NOT DETECTED NOT DETECTED Final   Streptococcus species NOT DETECTED NOT DETECTED Final   Streptococcus agalactiae NOT DETECTED NOT DETECTED Final   Streptococcus pneumoniae NOT DETECTED NOT DETECTED Final   Streptococcus pyogenes NOT DETECTED NOT DETECTED Final   A.calcoaceticus-baumannii NOT DETECTED NOT DETECTED Final   Bacteroides fragilis NOT DETECTED NOT DETECTED Final   Enterobacterales DETECTED (A) NOT DETECTED Final    Comment: Enterobacterales represent a large order of gram negative bacteria, not a single organism. CRITICAL RESULT CALLED TO, READ BACK BY AND VERIFIED WITH: L POINDEXTER,[email or web address]  11/20/21 MK    Enterobacter cloacae complex NOT DETECTED NOT DETECTED Final   Escherichia coli DETECTED (A) NOT DETECTED Final    Comment: CRITICAL RESULT CALLED TO, READ BACK BY AND VERIFIED WITH: L POINDEXTER,[email or web address]  11/20/21 MK    Klebsiella aerogenes  NOT DETECTED NOT DETECTED Final   Klebsiella oxytoca NOT DETECTED NOT DETECTED Final   Klebsiella pneumoniae NOT DETECTED NOT DETECTED Final   Proteus species NOT DETECTED NOT DETECTED Final   Salmonella species NOT DETECTED NOT DETECTED Final   Serratia marcescens NOT DETECTED NOT DETECTED Final   Haemophilus influenzae NOT DETECTED NOT DETECTED Final   Neisseria meningitidis NOT DETECTED NOT DETECTED Final   Pseudomonas aeruginosa NOT DETECTED NOT DETECTED Final   Stenotrophomonas maltophilia NOT DETECTED NOT DETECTED Final   Candida albicans NOT DETECTED NOT DETECTED Final   Candida auris NOT DETECTED NOT DETECTED Final   Candida glabrata NOT DETECTED NOT DETECTED Final   Candida krusei NOT DETECTED NOT DETECTED Final   Candida parapsilosis NOT DETECTED NOT DETECTED Final   Candida tropicalis NOT DETECTED NOT DETECTED Final   Cryptococcus neoformans/gattii NOT DETECTED NOT DETECTED Final   CTX-M ESBL NOT DETECTED NOT DETECTED Final   Carbapenem resistance IMP NOT DETECTED NOT DETECTED  Final   Carbapenem resistance KPC NOT DETECTED NOT DETECTED Final   Carbapenem resistance NDM NOT DETECTED NOT DETECTED Final   Carbapenem resist OXA 48 LIKE NOT DETECTED NOT DETECTED Final   Carbapenem resistance VIM NOT DETECTED NOT DETECTED Final    Comment: Performed at [facility], [address] [place], [place] [address]  Culture, blood (routine x 2)     Status: None (Preliminary result)   Collection Time: 11/20/21  7:50 AM   Specimen: BLOOD  Result Value Ref Range Status   Specimen Description   Final    BLOOD RIGHT ANTECUBITAL Performed at [facility], [address] [place], [place] [address]    Special Requests   Final    BOTTLES DRAWN AEROBIC AND ANAEROBIC Blood Culture results may not be optimal due to an inadequate volume of blood received in culture bottles Performed at [facility], [address] [place], [place] [address]    Culture  Setup Time   Final    GRAM NEGATIVE RODS IN BOTH AEROBIC AND ANAEROBIC BOTTLES    Culture   Final    NO GROWTH <12 HOURS Performed at [facility], [place] [address], [place], [place] [address]    Report Status PENDING  Incomplete         Radiology Studies: DG Chest Port 1 View  Result Date: 11/20/2021 CLINICAL DATA:  cough flank pain EXAM: PORTABLE CHEST 1 VIEW COMPARISON:  None Available. FINDINGS: The heart size and mediastinal contours are within normal limits. Both lungs are clear without consolidation, pleural effusion or vascular congestion. The visualized skeletal structures are unremarkable. Status post ACDF changes in the visualized lower cervical spine. IMPRESSION: No active disease. Electronically Signed   By: Frazier Richards M.D.   On: 11/20/2021 09:08   CT Renal Stone Study  Result Date: 11/20/2021 CLINICAL DATA:  Flank pain, kidney stone suspected EXAM: CT ABDOMEN AND PELVIS WITHOUT CONTRAST TECHNIQUE: Multidetector CT imaging of the abdomen and pelvis was performed following the  standard protocol without IV contrast. RADIATION DOSE REDUCTION: This exam was performed according to the departmental dose-optimization program which includes automated exposure control, adjustment of the mA and/or kV according to patient size and/or use of iterative reconstruction technique. COMPARISON:  CT 06/19/2008. FINDINGS: Lower chest: Bibasilar hypoventilatory changes. No acute abnormality. Hepatobiliary: No focal liver abnormality is seen. The gallbladder is unremarkable. Pancreas: Unremarkable. No pancreatic ductal dilatation or surrounding inflammatory changes. Spleen: Normal in size without focal  abnormality. Adrenals/Urinary Tract: Adrenal glands are unremarkable. Edematous appearance of the kidneys with perinephric and periureteral stranding bilaterally but no hydronephrosis or nephroureterolithiasis identified. No perinephric fluid collection identified. The bladder is mildly distended. Stomach/Bowel: The stomach is within normal limits. There is no evidence of bowel obstruction.The appendix is not definitively identified and is either decompressed or surgically absent. Vascular/Lymphatic: No significant vascular findings are present. No enlarged abdominal or pelvic lymph nodes. Reproductive: Small calcified in fibroids and subserosal/pedunculated fibroid posteriorly. Other: No abdominal wall hernia or abnormality. No abdominopelvic ascites. Musculoskeletal: No acute osseous abnormality. Bilateral hip arthroplasties with associated streak artifact. Degenerative disc disease with disc bulging, bilateral facet arthropathy, grade 1 anterolisthesis at L4-L5. IMPRESSION: Edematous appearance of the kidneys with bilateral perinephric and periureteral stranding but no hydronephrosis or nephroureterolithiasis identified. Findings are concerning for bilateral pyelonephritis. Recommend correlation with urinalysis. Electronically Signed   By: Caprice Renshaw M.D.   On: 11/20/2021 09:08        Scheduled  Meds:  docusate sodium  100 mg Oral BID   heparin  5,000 Units Subcutaneous Q8H   midodrine  5 mg Oral TID WC   pneumococcal 20-valent conjugate vaccine  0.5 mL Intramuscular Tomorrow-1000   traZODone  100 mg Oral QHS   venlafaxine XR  150 mg Oral Q breakfast   Continuous Infusions:  sodium chloride     cefTRIAXone (ROCEPHIN)  IV     promethazine (PHENERGAN) injection (IM or IVPB) Stopped (11/20/21 2249)     LOS: 1 day   Time spent= 35 mins    Asher Babilonia Joline Maxcy, MD [facility]  If 7PM-7AM, please contact night-coverage  11/21/2021, 7:44 AM

## 2021-11-22 ENCOUNTER — Inpatient Hospital Stay (HOSPITAL_COMMUNITY): Payer: BC Managed Care – PPO

## 2021-11-22 DIAGNOSIS — N390 Urinary tract infection, site not specified: Secondary | ICD-10-CM | POA: Diagnosis not present

## 2021-11-22 DIAGNOSIS — A419 Sepsis, unspecified organism: Secondary | ICD-10-CM | POA: Diagnosis not present

## 2021-11-22 DIAGNOSIS — R0609 Other forms of dyspnea: Secondary | ICD-10-CM

## 2021-11-22 LAB — BASIC METABOLIC PANEL
Anion gap: 8 (ref 5–15)
BUN: 20 mg/dL (ref 8–23)
CO2: 19 mmol/L — ABNORMAL LOW (ref 22–32)
Calcium: 8 mg/dL — ABNORMAL LOW (ref 8.9–10.3)
Chloride: 112 mmol/L — ABNORMAL HIGH (ref 98–111)
Creatinine, Ser: 1.71 mg/dL — ABNORMAL HIGH (ref 0.44–1.00)
GFR, Estimated: 33 mL/min — ABNORMAL LOW (ref >60)
Glucose, Bld: 121 mg/dL — ABNORMAL HIGH (ref 70–99)
Potassium: 3.8 mmol/L (ref 3.5–5.1)
Sodium: 139 mmol/L (ref 135–145)

## 2021-11-22 LAB — ECHOCARDIOGRAM COMPLETE
AR max vel: 2.32 cm2
AV Peak grad: 9.8 mmHg
Ao pk vel: 1.57 m/s
Area-P 1/2: 5.01 cm2
Height: 63 in
MV M vel: 4.54 m/s
MV Peak grad: 82.3 mmHg
S' Lateral: 3.2 cm
Weight: 2624.36 oz

## 2021-11-22 LAB — CBC
HCT: 26.3 % — ABNORMAL LOW (ref 36.0–46.0)
Hemoglobin: 8.5 g/dL — ABNORMAL LOW (ref 12.0–15.0)
MCH: 32.4 pg (ref 26.0–34.0)
MCHC: 32.3 g/dL (ref 30.0–36.0)
MCV: 100.4 fL — ABNORMAL HIGH (ref 80.0–100.0)
Platelets: 133 10*3/uL — ABNORMAL LOW (ref 150–400)
RBC: 2.62 MIL/uL — ABNORMAL LOW (ref 3.87–5.11)
RDW: 14.6 % (ref 11.5–15.5)
WBC: 8.1 10*3/uL (ref 4.0–10.5)
nRBC: 0 % (ref 0.0–0.2)

## 2021-11-22 LAB — CULTURE, BLOOD (ROUTINE X 2)

## 2021-11-22 LAB — GLUCOSE, CAPILLARY: Glucose-Capillary: 106 mg/dL — ABNORMAL HIGH (ref 70–99)

## 2021-11-22 LAB — BRAIN NATRIURETIC PEPTIDE: B Natriuretic Peptide: 944.5 pg/mL — ABNORMAL HIGH (ref 0.0–100.0)

## 2021-11-22 LAB — URINE CULTURE: Culture: >=100000 — AB

## 2021-11-22 LAB — CORTISOL: Cortisol, Plasma: 23.8 ug/dL

## 2021-11-22 LAB — TSH: TSH: 0.896 u[IU]/mL (ref 0.350–4.500)

## 2021-11-22 LAB — MAGNESIUM: Magnesium: 2.7 mg/dL — ABNORMAL HIGH (ref 1.7–2.4)

## 2021-11-22 MED ORDER — BENZONATATE 100 MG PO CAPS
100.0000 mg | ORAL_CAPSULE | Freq: Three times a day (TID) | ORAL | Status: DC | PRN
  Administered 2021-11-22 – 2021-11-26 (×8): 100 mg via ORAL
  Filled 2021-11-22 (×8): qty 1

## 2021-11-22 MED ORDER — NOREPINEPHRINE 4 MG/250ML-% IV SOLN
INTRAVENOUS | Status: AC
  Filled 2021-11-22: qty 250

## 2021-11-22 MED ORDER — ALBUMIN HUMAN 25 % IV SOLN
INTRAVENOUS | Status: AC
  Administered 2021-11-22: 12.5 g via INTRAVENOUS
  Filled 2021-11-22: qty 100

## 2021-11-22 MED ORDER — POLYETHYLENE GLYCOL 3350 17 G PO PACK
17.0000 g | PACK | Freq: Every day | ORAL | Status: DC | PRN
  Administered 2021-11-22: 17 g via ORAL
  Filled 2021-11-22: qty 1

## 2021-11-22 MED ORDER — GUAIFENESIN ER 600 MG PO TB12
600.0000 mg | ORAL_TABLET | Freq: Two times a day (BID) | ORAL | Status: DC | PRN
Start: 2021-11-22 — End: 2021-11-24
  Administered 2021-11-22 – 2021-11-23 (×3): 600 mg via ORAL
  Filled 2021-11-22 (×3): qty 1

## 2021-11-22 MED ORDER — FUROSEMIDE 10 MG/ML IJ SOLN
80.0000 mg | Freq: Once | INTRAMUSCULAR | Status: AC
  Administered 2021-11-22: 80 mg via INTRAVENOUS
  Filled 2021-11-22: qty 8

## 2021-11-22 MED ORDER — HYDROCORTISONE SOD SUC (PF) 100 MG IJ SOLR
50.0000 mg | Freq: Three times a day (TID) | INTRAMUSCULAR | Status: DC
  Administered 2021-11-22 – 2021-11-23 (×3): 50 mg via INTRAVENOUS
  Filled 2021-11-22 (×3): qty 2

## 2021-11-22 MED ORDER — ORAL CARE MOUTH RINSE: 15.0000 mL | OROMUCOSAL | Status: DC | PRN

## 2021-11-22 NOTE — Progress Notes (Signed)
PROGRESS NOTE    [name]  WTU:[id number] DOB: 1956/02/18 DOA: 11/20/2021 PCP: [name], PA   Brief Narrative:  66 y.o. female with medical history significant of insomnia, depression, GERD, hot flashes, chronic lower back pain comes to the hospital with complaints of weakness, fevers and chills.  Patient was admitted for sepsis secondary to UTI, AKI and bilateral pyelonephritis.  Patient was started on IV fluids, empiric IV Rocephin.  Due to persistent hypotension despite IV fluids, also required midodrine to be added which was slowly increased.   Assessment & Plan:  Principal Problem:   Sepsis secondary to UTI [facility]) Active Problems:   Pyelonephritis   AKI (acute kidney injury) (HCC)      Severe sepsis secondary to acute bilateral pyelonephritis - WBC slowly improving but still has soft blood pressure requiring fluid boluses overnight.  Aggressive IV fluids, cultures growing E. coli.  Sensitivities are pending.  Continue midodrine 10 mg 3 times daily.  Albumin and hydrocortisone IV added.  Random cortisol levels, TSH.  Holding off on IV fluids. - Foley catheter placed - CT abdomen pelvis reviewed-shows bilateral pyelonephritis, no evidence of renal stones/obstruction or hydronephrosis. Low threshold to broaden Abx.   Acute kidney injury, baseline 0.9.  Admission creatinine 1.8 -Creatinine still elevated at 1.95.  Renal function slowly improving but due to hypoperfusion there is suspicion for ATN.  Renal ultrasound.  Creatinine today 1.71.   Hypomagnesemia - Repletion   Insomnia -Continue home trazodone   GERD/depression -PPI as needed.  Continue daily effexor      DVT prophylaxis: Subcu heparin Code Status: Full code Family Communication: Family at bedside  Status is: Inpatient Remains inpatient appropriate because: Blood pressure remains soft, getting IV antibiotics, fluids, ongoing work-up for AKI.  Transfer patient to stepdown due to low  BPs     Subjective: Feeling better after multiple interventions as mentioned above as blood pressure has slowly improved.  No new complaints.  Still has dark sediments in her Foley catheter  Examination: Constitutional: Not in acute distress Respiratory: Clear to auscultation bilaterally Cardiovascular: Normal sinus rhythm, no rubs Abdomen: Nontender nondistended good bowel sounds Musculoskeletal: No edema noted Skin: No rashes seen Neurologic: CN 2-12 grossly intact.  And nonfocal Psychiatric: Normal judgment and insight. Alert and oriented x 3. Normal mood. Objective: Vitals:   11/22/21 0400 11/22/21 0500 11/22/21 0600 11/22/21 0611  BP: 101/64 94/70 (!) 76/59 106/69  Pulse: 81 85 94 (!) 102  Resp: (!) 28 (!) 22 17 (!) 25  Temp: 97.8 F (36.6 C)     TempSrc: Oral     SpO2: 97% 97% 98% 100%  Weight:      Height:        Intake/Output Summary (Last 24 hours) at 11/22/2021 0716 Last data filed at 11/22/2021 0308 Gross per 24 hour  Intake 1021.32 ml  Output 650 ml  Net 371.32 ml   Filed Weights   11/20/21 0703 11/21/21 0500  Weight: 70.3 kg 82.2 kg     Data Reviewed:   CBC: Recent Labs  Lab 11/20/21 0720 11/20/21 1622 11/21/21 0546 11/22/21 0242  WBC 15.7* 14.3* 10.8* 8.1  HGB 12.2 10.2* 9.2* 8.5*  HCT 35.7* 31.1* 29.7* 26.3*  MCV 94.9 99.7 104.2* 100.4*  PLT 154 138* 126* 133*   Basic Metabolic Panel: Recent Labs  Lab 11/20/21 0720 11/20/21 1622 11/21/21 0546 11/22/21 0242  NA 131*  --  136 139  K 3.5  --  4.1 3.8  CL 98  --  105 112*  CO2 23  --  22 19*  GLUCOSE 188*  --  136* 121*  BUN 16  --  23 20  CREATININE 1.83* 1.61* 1.95* 1.71*  CALCIUM 8.5*  --  7.5* 8.0*  MG  --   --  1.4* 2.7*   GFR: Estimated Creatinine Clearance: 34.2 mL/min (A) (by C-G formula based on SCr of 1.71 mg/dL (H)). Liver Function Tests: Recent Labs  Lab 11/20/21 0720  AST 52*  ALT 92*  ALKPHOS 110  BILITOT 1.9*  PROT 6.8  ALBUMIN 2.8*   Recent Labs  Lab  11/20/21 0720  LIPASE 22   No results for input(s): "AMMONIA" in the last 168 hours. Coagulation Profile: No results for input(s): "INR", "PROTIME" in the last 168 hours. Cardiac Enzymes: No results for input(s): "CKTOTAL", "CKMB", "CKMBINDEX", "TROPONINI" in the last 168 hours. BNP (last 3 results) No results for input(s): "PROBNP" in the last 8760 hours. HbA1C: No results for input(s): "HGBA1C" in the last 72 hours. CBG: Recent Labs  Lab 11/20/21 2029  GLUCAP 149*   Lipid Profile: No results for input(s): "CHOL", "HDL", "LDLCALC", "TRIG", "CHOLHDL", "LDLDIRECT" in the last 72 hours. Thyroid Function Tests: No results for input(s): "TSH", "T4TOTAL", "FREET4", "T3FREE", "THYROIDAB" in the last 72 hours. Anemia Panel: No results for input(s): "VITAMINB12", "FOLATE", "FERRITIN", "TIBC", "IRON", "RETICCTPCT" in the last 72 hours. Sepsis Labs: Recent Labs  Lab 11/20/21 0750 11/20/21 2032 11/21/21 2044  LATICACIDVEN 1.9 2.0* 1.8    Recent Results (from the past 240 hour(s))  Urine Culture     Status: Abnormal (Preliminary result)   Collection Time: 11/20/21  7:19 AM   Specimen: Urine, Clean Catch  Result Value Ref Range Status   Specimen Description   Final    URINE, CLEAN CATCH Performed at [facility], [address] [place], [place] [address]    Special Requests   Final    NONE Performed at [facility], [address] [place], [place] [address]    Culture (A)  Final    >=100,000 COLONIES/mL ESCHERICHIA COLI SUSCEPTIBILITIES TO FOLLOW Performed at [facility], [place] [address] [place], [place] [address]    Report Status PENDING  Incomplete  Culture, blood (routine x 2)     Status: Abnormal (Preliminary result)   Collection Time: 11/20/21  7:20 AM   Specimen: BLOOD  Result Value Ref Range Status   Specimen Description   Final    BLOOD LEFT ANTECUBITAL Performed at [facility], [address] [place], [place]  [address]    Special Requests   Final    BOTTLES DRAWN AEROBIC AND ANAEROBIC Blood Culture results may not be optimal due to an inadequate volume of blood received in culture bottles Performed at [facility], [address] [place], [place] [address]    Culture  Setup Time   Final    GRAM NEGATIVE RODS IN BOTH AEROBIC AND ANAEROBIC BOTTLES Organism ID to follow CRITICAL RESULT CALLED TO, READ BACK BY AND VERIFIED WITH: L POINDEXTER,[email or web address]  11/20/21 [place] Performed at [facility], [place] [address], [place], [place] [address]    Culture ESCHERICHIA COLI (A)  Final   Report Status PENDING  Incomplete  Blood Culture ID Panel (Reflexed)     Status: Abnormal   Collection Time: 11/20/21  7:20 AM  Result Value Ref Range Status   Enterococcus faecalis NOT DETECTED NOT DETECTED Final   Enterococcus  Faecium NOT DETECTED NOT DETECTED Final   Listeria monocytogenes NOT DETECTED NOT DETECTED Final   Staphylococcus species NOT DETECTED NOT DETECTED Final   Staphylococcus aureus (BCID) NOT DETECTED NOT DETECTED Final   Staphylococcus epidermidis NOT DETECTED NOT DETECTED Final   Staphylococcus lugdunensis NOT DETECTED NOT DETECTED Final   Streptococcus species NOT DETECTED NOT DETECTED Final   Streptococcus agalactiae NOT DETECTED NOT DETECTED Final   Streptococcus pneumoniae NOT DETECTED NOT DETECTED Final   Streptococcus pyogenes NOT DETECTED NOT DETECTED Final   A.calcoaceticus-baumannii NOT DETECTED NOT DETECTED Final   Bacteroides fragilis NOT DETECTED NOT DETECTED Final   Enterobacterales DETECTED (A) NOT DETECTED Final    Comment: Enterobacterales represent a large order of gram negative bacteria, not a single organism. CRITICAL RESULT CALLED TO, READ BACK BY AND VERIFIED WITH: L POINDEXTER,[email or web address]  11/20/21 [place]    Enterobacter cloacae complex NOT DETECTED NOT DETECTED Final   Escherichia coli DETECTED (A) NOT DETECTED Final    Comment: CRITICAL RESULT CALLED TO, READ  BACK BY AND VERIFIED WITH: L POINDEXTER,[email or web address]  11/20/21 [place]    Klebsiella aerogenes NOT DETECTED NOT DETECTED Final   Klebsiella oxytoca NOT DETECTED NOT DETECTED Final   Klebsiella pneumoniae NOT DETECTED NOT DETECTED Final   Proteus species NOT DETECTED NOT DETECTED Final   Salmonella species NOT DETECTED NOT DETECTED Final   Serratia marcescens NOT DETECTED NOT DETECTED Final   Haemophilus influenzae NOT DETECTED NOT DETECTED Final   Neisseria meningitidis NOT DETECTED NOT DETECTED Final   Pseudomonas aeruginosa NOT DETECTED NOT DETECTED Final   Stenotrophomonas maltophilia NOT DETECTED NOT DETECTED Final   Candida albicans NOT DETECTED NOT DETECTED Final   Candida auris NOT DETECTED NOT DETECTED Final   Candida glabrata NOT DETECTED NOT DETECTED Final   Candida krusei NOT DETECTED NOT DETECTED Final   Candida parapsilosis NOT DETECTED NOT DETECTED Final   Candida tropicalis NOT DETECTED NOT DETECTED Final   Cryptococcus neoformans/gattii NOT DETECTED NOT DETECTED Final   CTX-M ESBL NOT DETECTED NOT DETECTED Final   Carbapenem resistance IMP NOT DETECTED NOT DETECTED Final   Carbapenem resistance KPC NOT DETECTED NOT DETECTED Final   Carbapenem resistance NDM NOT DETECTED NOT DETECTED Final   Carbapenem resist OXA 48 LIKE NOT DETECTED NOT DETECTED Final   Carbapenem resistance VIM NOT DETECTED NOT DETECTED Final    Comment: Performed at [facility], [address], [place], [place] [address]  Culture, blood (routine x 2)     Status: None (Preliminary result)   Collection Time: 11/20/21  7:50 AM   Specimen: BLOOD  Result Value Ref Range Status   Specimen Description   Final    BLOOD RIGHT ANTECUBITAL Performed at [facility], [address] [place], [place] [address]    Special Requests   Final    BOTTLES DRAWN AEROBIC AND ANAEROBIC Blood Culture results may not be optimal due to an inadequate volume of blood received in culture bottles Performed at [facility], [address] [place], [place] [address]    Culture  Setup Time   Final    GRAM NEGATIVE RODS IN BOTH AEROBIC AND ANAEROBIC BOTTLES CRITICAL VALUE NOTED.  VALUE IS CONSISTENT WITH PREVIOUSLY REPORTED AND CALLED VALUE. Performed at [facility], [place] [address], [place], [place] [address]    Culture GRAM NEGATIVE RODS  Final   Report Status PENDING  Incomplete  MRSA Next Gen by PCR, Nasal     Status: None  Collection Time: 11/21/21  2:44 PM   Specimen: Nasal Mucosa; Nasal Swab  Result Value Ref Range Status   MRSA by PCR Next Gen NOT DETECTED NOT DETECTED Final    Comment: (NOTE) The GeneXpert MRSA Assay (FDA approved for NASAL specimens only), is one component of a comprehensive MRSA colonization surveillance program. It is not intended to diagnose MRSA infection nor to guide or monitor treatment for MRSA infections. Test performance is not FDA approved in patients less than 63 years old. Performed at [facility], [place] [address], [place], [place] [address]          Radiology Studies: DG CHEST PORT 1 VIEW  Result Date: 11/22/2021 CLINICAL DATA:  Dyspnea. EXAM: PORTABLE CHEST 1 VIEW COMPARISON:  Chest radiograph yesterday. FINDINGS: Low lung volumes. Mild cardiomegaly. Bronchovascular crowding versus vascular congestion. There may be small developing pleural effusions. No pneumothorax. IMPRESSION: Cardiomegaly with bronchovascular crowding versus vascular congestion. Possible developing small pleural effusions. Electronically Signed   By: Keith Rake M.D.   On: 11/22/2021 00:21   DG Chest Port 1 View  Result Date: 11/20/2021 CLINICAL DATA:  cough flank pain EXAM: PORTABLE CHEST 1 VIEW COMPARISON:  None Available. FINDINGS: The heart size and mediastinal contours are within normal limits. Both lungs are clear without consolidation, pleural effusion or vascular congestion. The visualized skeletal structures are unremarkable. Status  post ACDF changes in the visualized lower cervical spine. IMPRESSION: No active disease. Electronically Signed   By: Frazier Richards M.D.   On: 11/20/2021 09:08   CT Renal Stone Study  Result Date: 11/20/2021 CLINICAL DATA:  Flank pain, kidney stone suspected EXAM: CT ABDOMEN AND PELVIS WITHOUT CONTRAST TECHNIQUE: Multidetector CT imaging of the abdomen and pelvis was performed following the standard protocol without IV contrast. RADIATION DOSE REDUCTION: This exam was performed according to the departmental dose-optimization program which includes automated exposure control, adjustment of the mA and/or kV according to patient size and/or use of iterative reconstruction technique. COMPARISON:  CT 06/19/2008. FINDINGS: Lower chest: Bibasilar hypoventilatory changes. No acute abnormality. Hepatobiliary: No focal liver abnormality is seen. The gallbladder is unremarkable. Pancreas: Unremarkable. No pancreatic ductal dilatation or surrounding inflammatory changes. Spleen: Normal in size without focal abnormality. Adrenals/Urinary Tract: Adrenal glands are unremarkable. Edematous appearance of the kidneys with perinephric and periureteral stranding bilaterally but no hydronephrosis or nephroureterolithiasis identified. No perinephric fluid collection identified. The bladder is mildly distended. Stomach/Bowel: The stomach is within normal limits. There is no evidence of bowel obstruction.The appendix is not definitively identified and is either decompressed or surgically absent. Vascular/Lymphatic: No significant vascular findings are present. No enlarged abdominal or pelvic lymph nodes. Reproductive: Small calcified in fibroids and subserosal/pedunculated fibroid posteriorly. Other: No abdominal wall hernia or abnormality. No abdominopelvic ascites. Musculoskeletal: No acute osseous abnormality. Bilateral hip arthroplasties with associated streak artifact. Degenerative disc disease with disc bulging, bilateral facet  arthropathy, grade 1 anterolisthesis at L4-L5. IMPRESSION: Edematous appearance of the kidneys with bilateral perinephric and periureteral stranding but no hydronephrosis or nephroureterolithiasis identified. Findings are concerning for bilateral pyelonephritis. Recommend correlation with urinalysis. Electronically Signed   By: Maurine Simmering M.D.   On: 11/20/2021 09:08        Scheduled Meds:  Chlorhexidine Gluconate Cloth  6 each Topical Q0600   docusate sodium  100 mg Oral BID   heparin  5,000 Units Subcutaneous Q8H   midodrine  10 mg Oral TID WC   pneumococcal 20-valent conjugate vaccine  0.5 mL Intramuscular Tomorrow-1000   traZODone  100 mg Oral QHS   venlafaxine XR  150 mg Oral Q breakfast   Continuous Infusions:  sodium chloride 75 mL/hr at 11/22/21 0056   cefTRIAXone (ROCEPHIN)  IV 200 mL/hr at 11/21/21 1843   promethazine (PHENERGAN) injection (IM or IVPB) Stopped (11/21/21 1944)     LOS: 2 days   Time spent= 35 mins    Umair Rosiles Joline Maxcy, MD [facility]  If 7PM-7AM, please contact night-coverage  11/22/2021, 7:16 AM

## 2021-11-22 NOTE — Consult Note (Addendum)
NAME:  [name], MRN:  [id number], DOB:  December 15, 1955, LOS: 2 ADMISSION DATE:  11/20/2021, CONSULTATION DATE:  11/22/21 REFERRING MD:  Nelson Chimes MD [facility], CHIEF COMPLAINT:  dyspnea   History of Present Illness:  66 year old-year-old woman admitted with severe sepsis due to pyelonephritis, UTI, E. coli bacteremia whom we are consulted for evaluation of hypotension and kidney injury, hypoxemic respiratory failure.  Presented with weakness, fever, chills.  Endorsed poor p.o. intake, failure to thrive during grieving process of losing sister week prior.  She denied dysuria.  She was noticed to have leukocytosis on labs, UA concerning for infection with pyuria and AKI.  CT scan demonstrated pyelonephritis.  She was given appropriate antibiotics.  Subsequent urine culture and blood cultures demonstrate E. coli.  She has had borderline blood pressures, maps just above 65.  Systolics often in the 80s or 90s.  Urine output has picked up a bit over the last 24 hours.  Kidney function stable although above baseline.  Not worsening.  Pertinent  Medical History  N/a  Significant Hospital Events: Including procedures, antibiotic start and stop dates in addition to other pertinent events   8/4, admitted with concern for pyelonephritis, UTI 8/5 midodrine added for soft blood pressures, urine output marginal  Interim History / Subjective:  Ongoing borderline blood pressure, MAP 60-70 range, systolics dipping into the 80s.  Made some urine overnight and through the day yesterday.  A few hundred cc.  Creatinine stable this morning.  Objective   Blood pressure 99/80, pulse 82, temperature 97.8 F (36.6 C), temperature source Oral, resp. rate 15, height 5\' 3"  (1.6 m), weight 74.4 kg, SpO2 100 %.        Intake/Output Summary (Last 24 hours) at 11/22/2021 0910 Last data filed at 11/22/2021 0800 Gross per 24 hour  Intake 2618.46 ml  Output 650 ml  Net 1968.46 ml   Filed Weights   11/20/21 0703 11/21/21 0500  11/22/21 0500  Weight: 70.3 kg 82.2 kg 74.4 kg    Examination: General: Sitting in bed, no acute distress HENT: Atraumatic, normocephalic, moist mucous membranes Lungs: Crackles in the bases, normal work of breathing Cardiovascular: Warm, no lower extremity edema Abdomen: Nondistended, bowel sounds present Neuro: Follows commands, no focal deficits  Chest x-ray 11/21/2021 reviewed interpreted as interstitial prominence and small bilateral effusions concerning for volume overload Resolved Hospital Problem list   N/A  Assessment & Plan:  E. coli bacteremia, UTI, pyelonephritis as source with severe sepsis: Curiously no leukocyte esterase or nitrites but significant pyuria on UA.  E. coli in blood and urine culture.  Acute kidney injury as endorgan damage. --Pansensitive, continue current antibiotic  Hypotension: Lactate is cleared.  Creatinine not worsening.  Possible initial signs of shock with elevated lactate now improved.  Suspect related to sepsis. --Has received adequate IV fluid resuscitation, would avoid additional IV fluids at this time given mild dyspnea and hypoxemia --Continue midodrine per primary --Stress dose steroids ordered per primary --Recommend maintaining SBP greater than 100, can start peripheral norepinephrine if needed --TTE with normal EF, RV volume overload  Acute kidney injury: Suspect ATN in the setting of severe sepsis, pyelonephritis.  Urine output a bit better yesterday.  Creatinine stable, not worsening. --CT renal stone 11/2021 without signs of hydro, repeat renal ultrasound today per primary --diurese with signs of venous congestion on TTE, 80 mg IV lasix x 1  Acute hypoxemic respiratory failure: In the setting of aggressive volume resuscitation and signs of fluid overload on chest x-ray --  Diurese as able, would like to see blood pressures improved, consider in the coming hours to days --TTE as above  Best Practice (right click and "Reselect all  SmartList Selections" daily)   Per primary  Labs   CBC: Recent Labs  Lab 11/20/21 0720 11/20/21 1622 11/21/21 0546 11/22/21 0242  WBC 15.7* 14.3* 10.8* 8.1  HGB 12.2 10.2* 9.2* 8.5*  HCT 35.7* 31.1* 29.7* 26.3*  MCV 94.9 99.7 104.2* 100.4*  PLT 154 138* 126* 133*    Basic Metabolic Panel: Recent Labs  Lab 11/20/21 0720 11/20/21 1622 11/21/21 0546 11/22/21 0242  NA 131*  --  136 139  K 3.5  --  4.1 3.8  CL 98  --  105 112*  CO2 23  --  22 19*  GLUCOSE 188*  --  136* 121*  BUN 16  --  23 20  CREATININE 1.83* 1.61* 1.95* 1.71*  CALCIUM 8.5*  --  7.5* 8.0*  MG  --   --  1.4* 2.7*   GFR: Estimated Creatinine Clearance: 32.5 mL/min (A) (by C-G formula based on SCr of 1.71 mg/dL (H)). Recent Labs  Lab 11/20/21 0720 11/20/21 0750 11/20/21 1622 11/20/21 2032 11/21/21 0546 11/21/21 2044 11/22/21 0242  WBC 15.7*  --  14.3*  --  10.8*  --  8.1  LATICACIDVEN  --  1.9  --  2.0*  --  1.8  --     Liver Function Tests: Recent Labs  Lab 11/20/21 0720  AST 52*  ALT 92*  ALKPHOS 110  BILITOT 1.9*  PROT 6.8  ALBUMIN 2.8*   Recent Labs  Lab 11/20/21 0720  LIPASE 22   No results for input(s): "AMMONIA" in the last 168 hours.  ABG No results found for: "PHART", "PCO2ART", "PO2ART", "HCO3", "TCO2", "ACIDBASEDEF", "O2SAT"   Coagulation Profile: No results for input(s): "INR", "PROTIME" in the last 168 hours.  Cardiac Enzymes: No results for input(s): "CKTOTAL", "CKMB", "CKMBINDEX", "TROPONINI" in the last 168 hours.  HbA1C: No results found for: "HGBA1C"  CBG: Recent Labs  Lab 11/20/21 2029 11/22/21 0747  GLUCAP 149* 106*    Review of Systems:   No chest pain.  No orthopnea or PND.  No lower extremity swelling.  Comprehensive review of systems otherwise negative.  Past Medical History:  She,  has a past medical history of High cholesterol.   Surgical History:   Past Surgical History:  Procedure Laterality Date   BREAST BIOPSY Left 2017    stereotactic biopsy, benign   BUNIONECTOMY     CERVICAL SPINE SURGERY     KNEE SURGERY Left    rotator cuff surgery Right      Social History:   reports that she quit smoking about 18 years ago. Her smoking use included cigarettes. She started smoking about 48 years ago. She has a 7.50 pack-year smoking history. She has never used smokeless tobacco. She reports that she does not currently use alcohol. She reports that she does not currently use drugs.   Family History:  Her family history includes Diabetes in her father and sister; Heart disease in her brother, father, mother, sister, and sister; Kidney cancer in her father; Stroke in her brother. There is no history of Autoimmune disease.   Allergies Allergies  Allergen Reactions   Latex Hives   Hydromorphone Hcl Other (See Comments)     hallucinations   Oxycodone-Acetaminophen Nausea Only     Home Medications  Prior to Admission medications   Medication Sig Start Date End  Date Taking? Authorizing Provider  acetaminophen (TYLENOL) 325 MG tablet Take 650 mg by mouth daily as needed (pain).   Yes [provider]  BIOTIN PO Take 2 tablets by mouth daily.   Yes [provider]  Calcium Carbonate Antacid (TUMS PO) Take 2 tablets by mouth as needed (acid reflux).   Yes [provider]  CALCIUM-VITAMINS C & D PO Take 3 tablets by mouth daily.   Yes [provider]  ibuprofen (ADVIL) 200 MG tablet Take 400 mg by mouth daily as needed (pain).   Yes [provider]  Multiple Vitamins-Minerals (MULTIVITAMIN WITH MINERALS) tablet Take 2 tablets by mouth daily.   Yes [provider]  pantoprazole (PROTONIX) 40 MG tablet Take 40 mg by mouth as needed (acid reflux).   Yes [provider]  traZODone (DESYREL) 100 MG tablet Take 100 mg by mouth at bedtime. 10/23/21  Yes [provider]  venlafaxine XR (EFFEXOR-XR) 150 MG 24 hr capsule Take 150 mg by mouth daily with breakfast.    Yes [provider]  ALPRAZolam (XANAX) 0.25 MG tablet Take 1-2 tabs (0.25mg -0.50mg ) 30-60 minutes before procedure. May repeat if needed.Do not drive. Patient not taking: Reported on 11/20/2021 01/26/18   Anson Fret, MD  naproxen (NAPROSYN) 500 MG tablet Take 1 tablet (500 mg total) by mouth 2 (two) times daily. Patient not taking: Reported on 11/20/2021 12/04/17   Renne Crigler, PA-C     Critical care time:     CRITICAL CARE Performed by: Karren Burly   Total critical care time: 40 minutes  Critical care time was exclusive of separately billable procedures and treating other patients.  Critical care was necessary to treat or prevent imminent or life-threatening deterioration.  Critical care was time spent personally by me on the following activities: development of treatment plan with patient and/or surrogate as well as nursing, discussions with consultants, evaluation of patient's response to treatment, examination of patient, obtaining history from patient or surrogate, ordering and performing treatments and interventions, ordering and review of laboratory studies, ordering and review of radiographic studies, pulse oximetry and re-evaluation of patient's condition.

## 2021-11-22 NOTE — Progress Notes (Signed)
eLink Physician-Brief Progress Note Patient Name: [name] DOB: Jan 29, 1956 MRN: [id number]   Date of Service  11/22/2021  HPI/Events of Note  Sob. Looks more like anxiety. Now resting on my Camera evaluation with stable Vitals. S/p diuresis. AKI  eICU Interventions  Discussed. Continue care. Got nebs also.      Intervention Category Intermediate Interventions: Respiratory distress - evaluation and management  Ranee Gosselin 11/22/2021, 11:26 PM

## 2021-11-22 NOTE — Plan of Care (Signed)
  Problem: Education: Goal: Knowledge of General Education information will improve Description: Including pain rating scale, medication(s)/side effects and non-pharmacologic comfort measures Outcome: Progressing   Problem: Health Behavior/Discharge Planning: Goal: Ability to manage health-related needs will improve Outcome: Progressing   Problem: Clinical Measurements: Goal: Cardiovascular complication will be avoided Outcome: Progressing   Problem: Nutrition: Goal: Adequate nutrition will be maintained Outcome: Progressing   Problem: Pain Managment: Goal: General experience of comfort will improve Outcome: Progressing   Problem: Safety: Goal: Ability to remain free from injury will improve Outcome: Progressing   Problem: Skin Integrity: Goal: Risk for impaired skin integrity will decrease Outcome: Progressing   

## 2021-11-23 DIAGNOSIS — A419 Sepsis, unspecified organism: Secondary | ICD-10-CM | POA: Diagnosis not present

## 2021-11-23 DIAGNOSIS — R7881 Bacteremia: Secondary | ICD-10-CM

## 2021-11-23 DIAGNOSIS — A4151 Sepsis due to Escherichia coli [E. coli]: Principal | ICD-10-CM

## 2021-11-23 DIAGNOSIS — N390 Urinary tract infection, site not specified: Secondary | ICD-10-CM | POA: Diagnosis not present

## 2021-11-23 DIAGNOSIS — I959 Hypotension, unspecified: Secondary | ICD-10-CM

## 2021-11-23 DIAGNOSIS — B9620 Unspecified Escherichia coli [E. coli] as the cause of diseases classified elsewhere: Secondary | ICD-10-CM

## 2021-11-23 DIAGNOSIS — N170 Acute kidney failure with tubular necrosis: Secondary | ICD-10-CM

## 2021-11-23 DIAGNOSIS — R6520 Severe sepsis without septic shock: Secondary | ICD-10-CM

## 2021-11-23 DIAGNOSIS — J9601 Acute respiratory failure with hypoxia: Secondary | ICD-10-CM

## 2021-11-23 LAB — CBC
HCT: 29 % — ABNORMAL LOW (ref 36.0–46.0)
Hemoglobin: 9.7 g/dL — ABNORMAL LOW (ref 12.0–15.0)
MCH: 32.4 pg (ref 26.0–34.0)
MCHC: 33.4 g/dL (ref 30.0–36.0)
MCV: 97 fL (ref 80.0–100.0)
Platelets: 189 10*3/uL (ref 150–400)
RBC: 2.99 MIL/uL — ABNORMAL LOW (ref 3.87–5.11)
RDW: 14.4 % (ref 11.5–15.5)
WBC: 8.4 10*3/uL (ref 4.0–10.5)
nRBC: 0 % (ref 0.0–0.2)

## 2021-11-23 LAB — BASIC METABOLIC PANEL
Anion gap: 11 (ref 5–15)
BUN: 19 mg/dL (ref 8–23)
CO2: 22 mmol/L (ref 22–32)
Calcium: 8.3 mg/dL — ABNORMAL LOW (ref 8.9–10.3)
Chloride: 108 mmol/L (ref 98–111)
Creatinine, Ser: 1.6 mg/dL — ABNORMAL HIGH (ref 0.44–1.00)
GFR, Estimated: 36 mL/min — ABNORMAL LOW (ref >60)
Glucose, Bld: 150 mg/dL — ABNORMAL HIGH (ref 70–99)
Potassium: 3.4 mmol/L — ABNORMAL LOW (ref 3.5–5.1)
Sodium: 141 mmol/L (ref 135–145)

## 2021-11-23 LAB — MAGNESIUM: Magnesium: 2.3 mg/dL (ref 1.7–2.4)

## 2021-11-23 LAB — GLUCOSE, CAPILLARY: Glucose-Capillary: 159 mg/dL — ABNORMAL HIGH (ref 70–99)

## 2021-11-23 MED ORDER — FUROSEMIDE 10 MG/ML IJ SOLN
40.0000 mg | Freq: Once | INTRAMUSCULAR | Status: AC
  Administered 2021-11-23: 40 mg via INTRAVENOUS
  Filled 2021-11-23: qty 4

## 2021-11-23 MED ORDER — POTASSIUM CHLORIDE 20 MEQ PO PACK
40.0000 meq | PACK | Freq: Once | ORAL | Status: AC
  Administered 2021-11-23: 40 meq via ORAL
  Filled 2021-11-23: qty 2

## 2021-11-23 MED ORDER — CHLORHEXIDINE GLUCONATE CLOTH 2 % EX PADS
6.0000 | MEDICATED_PAD | Freq: Every day | CUTANEOUS | Status: DC
  Administered 2021-11-23: 6 via TOPICAL

## 2021-11-23 MED ORDER — CEFAZOLIN SODIUM-DEXTROSE 2-4 GM/100ML-% IV SOLN
2.0000 g | Freq: Three times a day (TID) | INTRAVENOUS | Status: DC
  Administered 2021-11-24 – 2021-11-26 (×7): 2 g via INTRAVENOUS
  Filled 2021-11-23 (×8): qty 100

## 2021-11-23 NOTE — Progress Notes (Signed)
Pt c/o SOB and stated she was wheezing, requesting neb tx.  RRT to assess and gave tx, and did not hear adventitious lung sounds. Approx 1 hour later, RN auscultated slight expiratory wheeze to Left upper lobe.  Elink notified, and informed of prior lasix orders and BNP.  MD camera'd in and advised to monitor.  No new orders. Further lung assessments clear/diminshed

## 2021-11-23 NOTE — Progress Notes (Signed)
NAME:  [name], MRN:  [id number], DOB:  May 14, 1955, LOS: 3 ADMISSION DATE:  11/20/2021, CONSULTATION DATE:  11/23/21 REFERRING MD:  Nelson Chimes MD [facility], CHIEF COMPLAINT:  dyspnea   History of Present Illness:  66 year old-year-old woman admitted with severe sepsis due to pyelonephritis, UTI, E. coli bacteremia whom we are consulted for evaluation of hypotension and kidney injury, hypoxemic respiratory failure.  Presented with weakness, fever, chills.  Endorsed poor p.o. intake, failure to thrive during grieving process of losing sister week prior.  She denied dysuria.  She was noticed to have leukocytosis on labs, UA concerning for infection with pyuria and AKI.  CT scan demonstrated pyelonephritis.  She was given appropriate antibiotics.  Subsequent urine culture and blood cultures demonstrate E. coli.  She has had borderline blood pressures, maps just above 65.  Systolics often in the 80s or 90s.  Urine output has picked up a bit over the last 24 hours.  Kidney function stable although above baseline.  Not worsening.  Pertinent  Medical History  N/a  Significant Hospital Events: Including procedures, antibiotic start and stop dates in addition to other pertinent events   8/4, admitted with concern for pyelonephritis, UTI 8/5 midodrine added for soft blood pressures, urine output marginal  Interim History / Subjective:  Feeling better today, appetite slowly returning.  Still having some right flank pain.  Hypertensive today.  Objective   Blood pressure (!) 152/96, pulse 82, temperature 98.3 F (36.8 C), temperature source Oral, resp. rate (!) 24, height 5\' 3"  (1.6 m), weight 72.4 kg, SpO2 99 %.        Intake/Output Summary (Last 24 hours) at 11/23/2021 0740 Last data filed at 11/23/2021 0645 Gross per 24 hour  Intake 565.63 ml  Output 3775 ml  Net -3209.37 ml    Filed Weights   11/21/21 0500 11/22/21 0500 11/23/21 0500  Weight: 82.2 kg 74.4 kg 72.4 kg    Examination: General:  Healthy appearing woman sitting up in bed in no acute distress HENT: [place]/AT, eyes anicteric Lungs: Breathing comfortably on room air, CTA B Cardiovascular: S1-S2, regular rate and rhythm Abdomen: Soft, patient only in right lower quadrant, right upper quadrant.  No guarding. Neuro: Awake and alert, answering questions appropriately.  Normal speech.  No focal deficits.  K+ 3.4 BUN 19 Cr 1.6  Resolved Hospital Problem list   N/A  Assessment & Plan:  Sepsis due to E. coli bacteremia, UTI, pyelonephritis. Acute kidney injury as endorgan damage. -- Ceftriaxone x7 days  Hypotension: Lactate is cleared.  Creatinine not worsening.  Possible initial signs of shock with elevated lactate now improved.  Suspect related to sepsis. -- Can DC midodrine - Start titrating stress dose steroids  Acute kidney injury: Suspect ATN in the setting of severe sepsis, pyelonephritis.  Urine output improving.  Creatinine improving.  Follow-up imaging is reassuring. -- Continue supportive care, maintain adequate preload - Strict I's/O - Renally dose medications and avoid nephrotoxic meds  Acute hypoxemic respiratory failure: In the setting of aggressive volume resuscitation and signs of fluid overload on chest x-ray -- Diuresis as able -Pulmonary hygiene, OOB mobility - Wean supplemental oxygen as able  Hypokalemia - Repleted  Likely can transfer out of SDU today. PCCM will sign off.  Best Practice (right click and "Reselect all SmartList Selections" daily)   Per primary  Labs   CBC: Recent Labs  Lab 11/20/21 0720 11/20/21 1622 11/21/21 0546 11/22/21 0242 11/23/21 0230  WBC 15.7* 14.3* 10.8* 8.1 8.4  HGB  12.2 10.2* 9.2* 8.5* 9.7*  HCT 35.7* 31.1* 29.7* 26.3* 29.0*  MCV 94.9 99.7 104.2* 100.4* 97.0  PLT 154 138* 126* 133* 189     Basic Metabolic Panel: Recent Labs  Lab 11/20/21 0720 11/20/21 1622 11/21/21 0546 11/22/21 0242 11/23/21 0230  NA 131*  --  136 139 141  K 3.5  --  4.1  3.8 3.4*  CL 98  --  105 112* 108  CO2 23  --  22 19* 22  GLUCOSE 188*  --  136* 121* 150*  BUN 16  --  23 20 19   CREATININE 1.83* 1.61* 1.95* 1.71* 1.60*  CALCIUM 8.5*  --  7.5* 8.0* 8.3*  MG  --   --  1.4* 2.7* 2.3    GFR: Estimated Creatinine Clearance: 34.3 mL/min (A) (by C-G formula based on SCr of 1.6 mg/dL (H)). Recent Labs  Lab 11/20/21 0750 11/20/21 1622 11/20/21 2032 11/21/21 0546 11/21/21 2044 11/22/21 0242 11/23/21 0230  WBC  --  14.3*  --  10.8*  --  8.1 8.4  LATICACIDVEN 1.9  --  2.0*  --  1.8  --   --      Liver Function Tests: Recent Labs  Lab 11/20/21 0720  AST 52*  ALT 92*  ALKPHOS 110  BILITOT 1.9*  PROT 6.8  ALBUMIN 2.8*      01/20/22, DO 11/23/21 9:27 AM [facility]
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
## 2021-11-23 NOTE — TOC Initial Note (Signed)
Transition of Care [facility]) - Initial/Assessment Note    Patient Details  Name: [name] MRN: [id number] Date of Birth: [birth year]  Transition of Care [facility]) CM/SW Contact:    Golda Acre, RN Phone Number: 11/23/2021, 8:09 AM  Clinical Narrative:                  Transition of Care [facility]) Screening Note   Patient Details  Name: [name] Date of Birth: 09-Feb-1956   Transition of Care [facility]) CM/SW Contact:    Golda Acre, RN Phone Number: 11/23/2021, 8:09 AM    Transition of Care Department [facility]) has reviewed patient and no TOC needs have been identified at this time. We will continue to monitor patient advancement through interdisciplinary progression rounds. If new patient transition needs arise, please place a TOC consult.    Expected Discharge Plan: Home/Self Care Barriers to Discharge: Continued Medical Work up   Patient Goals and CMS Choice Patient states their goals for this hospitalization and ongoing recovery are:: to go home CMS Medicare.gov Compare Post Acute Care list provided to:: Patient Choice offered to / list presented to : Patient  Expected Discharge Plan and Services Expected Discharge Plan: Home/Self Care   Discharge Planning Services: CM Consult   Living arrangements for the past 2 months: Single Family Home                                      Prior Living Arrangements/Services Living arrangements for the past 2 months: Single Family Home Lives with:: Self Patient language and need for interpreter reviewed:: Yes Do you feel safe going back to the place where you live?: Yes            Criminal Activity/Legal Involvement Pertinent to Current Situation/Hospitalization: No - Comment as needed  Activities of Daily Living Home Assistive Devices/Equipment: None ADL Screening (condition at time of admission) Patient's cognitive ability adequate to safely complete daily activities?: No Is the patient deaf or have  difficulty hearing?: No Does the patient have difficulty seeing, even when wearing glasses/contacts?: No Does the patient have difficulty concentrating, remembering, or making decisions?: No Patient able to express need for assistance with ADLs?: Yes Does the patient have difficulty dressing or bathing?: No Independently performs ADLs?: Yes (appropriate for developmental age) Does the patient have difficulty walking or climbing stairs?: No Weakness of Legs: None Weakness of Arms/Hands: None  Permission Sought/Granted                  Emotional Assessment Appearance:: Appears stated age Attitude/Demeanor/Rapport: Engaged Affect (typically observed): Calm Orientation: : Oriented to Self, Oriented to Place, Oriented to  Time, Oriented to Situation Alcohol / Substance Use: Not Applicable, Never Used Psych Involvement: No (comment)  Admission diagnosis:  Pyelonephritis [N12] Sepsis, due to unspecified organism, unspecified whether acute organ dysfunction present [facility]) [A41.9] Patient Active Problem List   Diagnosis Date Noted   Pyelonephritis 11/20/2021   Sepsis secondary to UTI (HCC) 11/20/2021   AKI (acute kidney injury) (HCC) 11/20/2021   Trigeminal neuralgia of right side of face 01/24/2018   MENOPAUSE-RELATED VASOMOTOR SYMPTOMS, HOT FLASHES 01/19/2010   PCP:  [name], PA Pharmacy:   [id number] - [place], [place] - [address] [place] [address] Phone: [phone] Fax: [phone]  Social Determinants of Health (SDOH) Interventions    Readmission Risk Interventions     No data to display           

## 2021-11-23 NOTE — Plan of Care (Signed)
  Problem: Nutrition: Goal: Adequate nutrition will be maintained Outcome: Progressing   Problem: Coping: Goal: Level of anxiety will decrease Outcome: Progressing   Problem: Pain Managment: Goal: General experience of comfort will improve Outcome: Progressing   Problem: Safety: Goal: Ability to remain free from injury will improve Outcome: Progressing   Problem: Skin Integrity: Goal: Risk for impaired skin integrity will decrease Outcome: Progressing   Problem: Fluid Volume: Goal: Hemodynamic stability will improve Outcome: Progressing

## 2021-11-23 NOTE — Progress Notes (Signed)
PROGRESS NOTE    [name]  WEX:[id number] DOB: October 06, 1955 DOA: 11/20/2021 PCP: [name], PA   Brief Narrative:  66 y.o. female with medical history significant of insomnia, depression, GERD, hot flashes, chronic lower back pain comes to the hospital with complaints of weakness, fevers and chills.  Patient was admitted for sepsis secondary to UTI, AKI and bilateral pyelonephritis.  Patient was started on IV fluids, empiric IV Rocephin.  Due to persistent hypotension despite IV fluids, initially patient also required midodrine, albumin and stress dose steroids.  But later due to signs of volume overload also received Lasix.   Assessment & Plan:  Principal Problem:   Sepsis secondary to UTI [facility]) Active Problems:   Pyelonephritis   AKI (acute kidney injury) (HCC)      Severe sepsis secondary to acute bilateral pyelonephritis - Cultures are growing pansensitive E. coli, continue IV Rocephin.  Blood pressure is better therefore we will discontinue midodrine, hydrocortisone. - Monitor urine output, over next 24 hours we can also attempt to remove her Foley catheter. - CT abdomen pelvis reviewed-shows bilateral pyelonephritis, no evidence of renal stones/obstruction or hydronephrosis. Low threshold to broaden Abx.   Acute kidney injury, baseline 0.9.  Admission creatinine 1.8 -Creatinine still elevated at 1.95.  Renal function slowly improving but due to hypoperfusion there is suspicion for ATN.  Renal ultrasound is negative, creatinine today 1.6  Acute hypoxia, likely from volume overload. - BNP is up as well.  Echocardiogram unremarkable, received IV Lasix after her blood pressure improved.   Hypomagnesemia/hypokalemia - Repletion   Insomnia -Continue home trazodone   GERD/depression -PPI as needed.  Continue daily effexor      DVT prophylaxis: Subcu heparin Code Status: Full code Family Communication: Family at bedside  Status is: Inpatient Remains  inpatient appropriate because: Blood pressures have improved.  We will transfer to MedSurg floor.   Subjective: Doing better no complaints.  Overnight required IV Lasix.  Examination: Constitutional: Not in acute distress Respiratory: Mild bibasilar crackles Cardiovascular: Normal sinus rhythm, no rubs Abdomen: Nontender nondistended good bowel sounds Musculoskeletal: No edema noted Skin: No rashes seen Neurologic: CN 2-12 grossly intact.  And nonfocal Psychiatric: Normal judgment and insight. Alert and oriented x 3. Normal mood. Objective: Vitals:   11/23/21 0400 11/23/21 0500 11/23/21 0600 11/23/21 0700  BP: 117/68 138/82 135/86 (!) 152/96  Pulse: 77 77 76 82  Resp: (!) 28 (!) 21 18 (!) 24  Temp: 98.3 F (36.8 C)     TempSrc: Oral     SpO2: 97% 96% 100% 99%  Weight:  72.4 kg    Height:        Intake/Output Summary (Last 24 hours) at 11/23/2021 0729 Last data filed at 11/23/2021 0645 Gross per 24 hour  Intake 565.63 ml  Output 3775 ml  Net -3209.37 ml   Filed Weights   11/21/21 0500 11/22/21 0500 11/23/21 0500  Weight: 82.2 kg 74.4 kg 72.4 kg     Data Reviewed:   CBC: Recent Labs  Lab 11/20/21 0720 11/20/21 1622 11/21/21 0546 11/22/21 0242 11/23/21 0230  WBC 15.7* 14.3* 10.8* 8.1 8.4  HGB 12.2 10.2* 9.2* 8.5* 9.7*  HCT 35.7* 31.1* 29.7* 26.3* 29.0*  MCV 94.9 99.7 104.2* 100.4* 97.0  PLT 154 138* 126* 133* 189   Basic Metabolic Panel: Recent Labs  Lab 11/20/21 0720 11/20/21 1622 11/21/21 0546 11/22/21 0242 11/23/21 0230  NA 131*  --  136 139 141  K 3.5  --  4.1 3.8  3.4*  CL 98  --  105 112* 108  CO2 23  --  22 19* 22  GLUCOSE 188*  --  136* 121* 150*  BUN 16  --  23 20 19   CREATININE 1.83* 1.61* 1.95* 1.71* 1.60*  CALCIUM 8.5*  --  7.5* 8.0* 8.3*  MG  --   --  1.4* 2.7* 2.3   GFR: Estimated Creatinine Clearance: 34.3 mL/min (A) (by C-G formula based on SCr of 1.6 mg/dL (H)). Liver Function Tests: Recent Labs  Lab 11/20/21 0720  AST 52*   ALT 92*  ALKPHOS 110  BILITOT 1.9*  PROT 6.8  ALBUMIN 2.8*   Recent Labs  Lab 11/20/21 0720  LIPASE 22   No results for input(s): "AMMONIA" in the last 168 hours. Coagulation Profile: No results for input(s): "INR", "PROTIME" in the last 168 hours. Cardiac Enzymes: No results for input(s): "CKTOTAL", "CKMB", "CKMBINDEX", "TROPONINI" in the last 168 hours. BNP (last 3 results) No results for input(s): "PROBNP" in the last 8760 hours. HbA1C: No results for input(s): "HGBA1C" in the last 72 hours. CBG: Recent Labs  Lab 11/20/21 2029 11/22/21 0747 11/23/21 0727  GLUCAP 149* 106* 159*   Lipid Profile: No results for input(s): "CHOL", "HDL", "LDLCALC", "TRIG", "CHOLHDL", "LDLDIRECT" in the last 72 hours. Thyroid Function Tests: Recent Labs    11/22/21 0745  TSH 0.896   Anemia Panel: No results for input(s): "VITAMINB12", "FOLATE", "FERRITIN", "TIBC", "IRON", "RETICCTPCT" in the last 72 hours. Sepsis Labs: Recent Labs  Lab 11/20/21 0750 11/20/21 2032 11/21/21 2044  LATICACIDVEN 1.9 2.0* 1.8    Recent Results (from the past 240 hour(s))  Urine Culture     Status: Abnormal   Collection Time: 11/20/21  7:19 AM   Specimen: Urine, Clean Catch  Result Value Ref Range Status   Specimen Description   Final    URINE, CLEAN CATCH Performed at [facility] [place], [address] [place], [place] [address]    Special Requests   Final    NONE Performed at [facility] [place], [address] [place], [place] [address]    Culture >=100,000 COLONIES/mL ESCHERICHIA COLI (A)  Final   Report Status 11/22/2021 FINAL  Final   Organism ID, Bacteria ESCHERICHIA COLI (A)  Final      Susceptibility   Escherichia coli - MIC*    AMPICILLIN <=2 SENSITIVE Sensitive     CEFAZOLIN <=4 SENSITIVE Sensitive     CEFEPIME <=0.12 SENSITIVE Sensitive     CEFTRIAXONE <=0.25 SENSITIVE Sensitive     CIPROFLOXACIN <=0.25 SENSITIVE Sensitive     GENTAMICIN <=1 SENSITIVE Sensitive      IMIPENEM <=0.25 SENSITIVE Sensitive     NITROFURANTOIN <=16 SENSITIVE Sensitive     TRIMETH/SULFA <=20 SENSITIVE Sensitive     AMPICILLIN/SULBACTAM <=2 SENSITIVE Sensitive     PIP/TAZO <=4 SENSITIVE Sensitive     * >=100,000 COLONIES/mL ESCHERICHIA COLI  Culture, blood (routine x 2)     Status: Abnormal   Collection Time: 11/20/21  7:20 AM   Specimen: BLOOD  Result Value Ref Range Status   Specimen Description   Final    BLOOD LEFT ANTECUBITAL Performed at [facility] [place], [address] [place], [place] [address]    Special Requests   Final    BOTTLES DRAWN AEROBIC AND ANAEROBIC Blood Culture results may not be optimal due to an inadequate volume of blood received in culture bottles Performed at [facility] [place], [address] [place], [place] [address]    Culture  Setup Time   Final    GRAM NEGATIVE RODS IN BOTH AEROBIC AND ANAEROBIC BOTTLES Organism ID to follow CRITICAL RESULT CALLED TO, READ BACK BY AND VERIFIED WITH: L POINDEXTER,[email or web address]  11/20/21 MK Performed at [facility], [address] [place], [place] [address]    Culture ESCHERICHIA COLI (A)  Final   Report Status 11/22/2021 FINAL  Final   Organism ID, Bacteria ESCHERICHIA COLI  Final      Susceptibility   Escherichia coli - MIC*    AMPICILLIN <=2 SENSITIVE Sensitive     CEFAZOLIN <=4 SENSITIVE Sensitive     CEFEPIME <=0.12 SENSITIVE Sensitive     CEFTAZIDIME <=1 SENSITIVE Sensitive     CEFTRIAXONE <=0.25 SENSITIVE Sensitive     CIPROFLOXACIN <=0.25 SENSITIVE Sensitive     GENTAMICIN <=1 SENSITIVE Sensitive     IMIPENEM <=0.25 SENSITIVE Sensitive     TRIMETH/SULFA <=20 SENSITIVE Sensitive     AMPICILLIN/SULBACTAM <=2 SENSITIVE Sensitive     PIP/TAZO <=4 SENSITIVE Sensitive     * ESCHERICHIA COLI  Blood Culture ID Panel (Reflexed)     Status: Abnormal   Collection Time: 11/20/21  7:20 AM  Result Value Ref Range Status   Enterococcus faecalis NOT DETECTED NOT DETECTED Final    Enterococcus Faecium NOT DETECTED NOT DETECTED Final   Listeria monocytogenes NOT DETECTED NOT DETECTED Final   Staphylococcus species NOT DETECTED NOT DETECTED Final   Staphylococcus aureus (BCID) NOT DETECTED NOT DETECTED Final   Staphylococcus epidermidis NOT DETECTED NOT DETECTED Final   Staphylococcus lugdunensis NOT DETECTED NOT DETECTED Final   Streptococcus species NOT DETECTED NOT DETECTED Final   Streptococcus agalactiae NOT DETECTED NOT DETECTED Final   Streptococcus pneumoniae NOT DETECTED NOT DETECTED Final   Streptococcus pyogenes NOT DETECTED NOT DETECTED Final   A.calcoaceticus-baumannii NOT DETECTED NOT DETECTED Final   Bacteroides fragilis NOT DETECTED NOT DETECTED Final   Enterobacterales DETECTED (A) NOT DETECTED Final    Comment: Enterobacterales represent a large order of gram negative bacteria, not a single organism. CRITICAL RESULT CALLED TO, READ BACK BY AND VERIFIED WITH: L POINDEXTER,[email or web address]  11/20/21 MK    Enterobacter cloacae complex NOT DETECTED NOT DETECTED Final   Escherichia coli DETECTED (A) NOT DETECTED Final    Comment: CRITICAL RESULT CALLED TO, READ BACK BY AND VERIFIED WITH: L POINDEXTER,[email or web address]  11/20/21 MK    Klebsiella aerogenes NOT DETECTED NOT DETECTED Final   Klebsiella oxytoca NOT DETECTED NOT DETECTED Final   Klebsiella pneumoniae NOT DETECTED NOT DETECTED Final   Proteus species NOT DETECTED NOT DETECTED Final   Salmonella species NOT DETECTED NOT DETECTED Final   Serratia marcescens NOT DETECTED NOT DETECTED Final   Haemophilus influenzae NOT DETECTED NOT DETECTED Final   Neisseria meningitidis NOT DETECTED NOT DETECTED Final   Pseudomonas aeruginosa NOT DETECTED NOT DETECTED Final   Stenotrophomonas maltophilia NOT DETECTED NOT DETECTED Final   Candida albicans NOT DETECTED NOT DETECTED Final   Candida auris NOT DETECTED NOT DETECTED Final   Candida glabrata NOT DETECTED NOT DETECTED Final   Candida krusei NOT DETECTED NOT  DETECTED Final   Candida parapsilosis NOT DETECTED NOT DETECTED Final   Candida tropicalis NOT DETECTED NOT DETECTED Final   Cryptococcus neoformans/gattii NOT DETECTED NOT DETECTED Final   CTX-M ESBL NOT DETECTED NOT DETECTED Final   Carbapenem resistance IMP NOT DETECTED NOT DETECTED Final   Carbapenem resistance KPC NOT DETECTED NOT DETECTED Final   Carbapenem resistance  NDM NOT DETECTED NOT DETECTED Final   Carbapenem resist OXA 48 LIKE NOT DETECTED NOT DETECTED Final   Carbapenem resistance VIM NOT DETECTED NOT DETECTED Final    Comment: Performed at [facility], [address] [place], [place] [address]  Culture, blood (routine x 2)     Status: Abnormal   Collection Time: 11/20/21  7:50 AM   Specimen: BLOOD  Result Value Ref Range Status   Specimen Description   Final    BLOOD RIGHT ANTECUBITAL Performed at [facility], [address] [place], [place] [address]    Special Requests   Final    BOTTLES DRAWN AEROBIC AND ANAEROBIC Blood Culture results may not be optimal due to an inadequate volume of blood received in culture bottles Performed at [facility], [address] [place], [place] [address]    Culture  Setup Time   Final    GRAM NEGATIVE RODS IN BOTH AEROBIC AND ANAEROBIC BOTTLES CRITICAL VALUE NOTED.  VALUE IS CONSISTENT WITH PREVIOUSLY REPORTED AND CALLED VALUE.    Culture (A)  Final    ESCHERICHIA COLI SUSCEPTIBILITIES PERFORMED ON PREVIOUS CULTURE WITHIN THE LAST 5 DAYS. Performed at [facility], [address] [place], [place] [address]    Report Status 11/22/2021 FINAL  Final  MRSA Next Gen by PCR, Nasal     Status: None   Collection Time: 11/21/21  2:44 PM   Specimen: Nasal Mucosa; Nasal Swab  Result Value Ref Range Status   MRSA by PCR Next Gen NOT DETECTED NOT DETECTED Final    Comment: (NOTE) The GeneXpert MRSA Assay (FDA approved for NASAL specimens only), is one component of a comprehensive MRSA colonization  surveillance program. It is not intended to diagnose MRSA infection nor to guide or monitor treatment for MRSA infections. Test performance is not FDA approved in patients less than 41 years old. Performed at [facility], [address], [place], [place] [address]          Radiology Studies: US RENAL  Result Date: 11/22/2021 CLINICAL DATA:  Acute kidney injury. EXAM: RENAL / URINARY TRACT ULTRASOUND COMPLETE COMPARISON:  Abdominopelvic CT 11/20/2021. FINDINGS: Right Kidney: Renal measurements: 12.8 x 4.9 x 6.5 cm = volume: 214.2 mL. Echogenicity within normal limits. No mass or hydronephrosis visualized. Left Kidney: Renal measurements: 11.1 x 5.8 x 5.4 cm = volume: 181.8 mL. Echogenicity within normal limits. No mass or hydronephrosis visualized. Possible minimal perinephric fluid along the lower pole. Bladder: Decompressed by Foley catheter. Other: Examination limited by body habitus and inability to suspend respiration. IMPRESSION: 1. Both kidneys are normal in size without hydronephrosis. 2. No focal renal abnormalities are identified. Small amount of perinephric fluid on the left. More obvious perinephric soft tissue stranding bilaterally on recent CT is nonspecific and could reflect renal inflammatory disease or acute tubular necrosis. 3. Decompressed urinary bladder. Electronically Signed   By: Carey Bullocks M.D.   On: 11/22/2021 10:17   ECHOCARDIOGRAM COMPLETE  Result Date: 11/22/2021    ECHOCARDIOGRAM REPORT   Patient Name:   ERNESTYNE CALDWELL Forsman Date of Exam: 11/22/2021 Medical Rec #:  [id number]       Height:       63.0 in Accession #:    [id number]      Weight:       164.0 lb Date of Birth:  [birth year]       BSA:  1.777 m Patient Age:    65 years        BP:           99/80 mmHg Patient Gender: F               HR:           90 bpm. Exam Location:  Inpatient Procedure: 2D Echo, Cardiac Doppler and Color Doppler Indications:    Dyspnea  History:        Patient has no  prior history of Echocardiogram examinations.  Sonographer:    Eduard Roux Referring Phys: [phone] MATTHEW R HUNSUCKER IMPRESSIONS  1. Left ventricular ejection fraction, by estimation, is 55 to 60%. The left ventricle has normal function. The left ventricle has no regional wall motion abnormalities. Left ventricular diastolic parameters were normal. There is the interventricular septum is flattened in systole and diastole, consistent with right ventricular pressure and volume overload.  2. Right ventricular systolic function is normal. The right ventricular size is mildly enlarged. There is mildly elevated pulmonary artery systolic pressure.  3. Right atrial size was mildly dilated.  4. The mitral valve is normal in structure. Mild mitral valve regurgitation. No evidence of mitral stenosis.  5. Tricuspid valve regurgitation is moderate.  6. The aortic valve is tricuspid. Aortic valve regurgitation is not visualized. No aortic stenosis is present.  7. The inferior vena cava is dilated in size with <50% respiratory variability, suggesting right atrial pressure of 15 mmHg. Comparison(s): No prior Echocardiogram. FINDINGS  Left Ventricle: Left ventricular ejection fraction, by estimation, is 55 to 60%. The left ventricle has normal function. The left ventricle has no regional wall motion abnormalities. The left ventricular internal cavity size was normal in size. There is  no left ventricular hypertrophy. The interventricular septum is flattened in systole and diastole, consistent with right ventricular pressure and volume overload. Left ventricular diastolic parameters were normal. Right Ventricle: The right ventricular size is mildly enlarged. Right ventricular systolic function is normal. There is mildly elevated pulmonary artery systolic pressure. The tricuspid regurgitant velocity is 2.51 m/s, and with an assumed right atrial pressure of 15 mmHg, the estimated right ventricular systolic pressure is 40.2 mmHg.  Left Atrium: Left atrial size was normal in size. Right Atrium: Right atrial size was mildly dilated. Pericardium: There is no evidence of pericardial effusion. Mitral Valve: The mitral valve is normal in structure. Mild mitral valve regurgitation. No evidence of mitral valve stenosis. Tricuspid Valve: The tricuspid valve is normal in structure. Tricuspid valve regurgitation is moderate . No evidence of tricuspid stenosis. Aortic Valve: The aortic valve is tricuspid. Aortic valve regurgitation is not visualized. No aortic stenosis is present. Aortic valve peak gradient measures 9.8 mmHg. Pulmonic Valve: The pulmonic valve was normal in structure. Pulmonic valve regurgitation is trivial. No evidence of pulmonic stenosis. Aorta: The aortic root is normal in size and structure. Venous: The inferior vena cava is dilated in size with less than 50% respiratory variability, suggesting right atrial pressure of 15 mmHg. IAS/Shunts: There is left bowing of the interatrial septum, suggestive of elevated right atrial pressure. No atrial level shunt detected by color flow Doppler.  LEFT VENTRICLE PLAX 2D LVIDd:         4.30 cm   Diastology LVIDs:         3.20 cm   LV e' medial:    7.97 cm/s LV PW:         1.00 cm   LV E/e' medial:  10.5 LV IVS:        1.00 cm   LV e' lateral:   11.70 cm/s LVOT diam:     1.90 cm   LV E/e' lateral: 7.1 LV SV:         52 LV SV Index:   29 LVOT Area:     2.84 cm  RIGHT VENTRICLE             IVC RV Basal diam:  3.70 cm     IVC diam: 2.60 cm RV Mid diam:    3.60 cm RV S prime:     16.70 cm/s TAPSE (M-mode): 1.5 cm LEFT ATRIUM             Index        RIGHT ATRIUM           Index LA diam:        4.10 cm 2.31 cm/m   RA Area:     20.10 cm LA Vol (A2C):   56.8 ml 31.96 ml/m  RA Volume:   63.10 ml  35.50 ml/m LA Vol (A4C):   45.3 ml 25.49 ml/m LA Biplane Vol: 53.7 ml 30.21 ml/m  AORTIC VALVE                 PULMONIC VALVE AV Area (Vmax): 2.32 cm     PV Vmax:       0.73 m/s AV Vmax:        156.50  cm/s  PV Peak grad:  2.1 mmHg AV Peak Grad:   9.8 mmHg LVOT Vmax:      128.00 cm/s LVOT Vmean:     68.200 cm/s LVOT VTI:       0.183 m  AORTA Ao Root diam: 2.80 cm Ao Asc diam:  3.10 cm MITRAL VALVE               TRICUSPID VALVE MV Area (PHT): 5.01 cm    TR Peak grad:   25.2 mmHg MV Decel Time: 152 msec    TR Vmax:        251.00 cm/s MR Peak grad: 82.3 mmHg MR Vmax:      453.50 cm/s  SHUNTS MV E velocity: 83.30 cm/s  Systemic VTI:  0.18 m MV A velocity: 58.70 cm/s  Systemic Diam: 1.90 cm MV E/A ratio:  1.42 Olga Millers MD Electronically signed by Olga Millers MD Signature Date/Time: 11/22/2021/9:49:19 AM    Final    DG CHEST PORT 1 VIEW  Result Date: 11/22/2021 CLINICAL DATA:  Dyspnea. EXAM: PORTABLE CHEST 1 VIEW COMPARISON:  Chest radiograph yesterday. FINDINGS: Low lung volumes. Mild cardiomegaly. Bronchovascular crowding versus vascular congestion. There may be small developing pleural effusions. No pneumothorax. IMPRESSION: Cardiomegaly with bronchovascular crowding versus vascular congestion. Possible developing small pleural effusions. Electronically Signed   By: Narda Rutherford M.D.   On: 11/22/2021 00:21        Scheduled Meds:  Chlorhexidine Gluconate Cloth  6 each Topical Daily   docusate sodium  100 mg Oral BID   heparin  5,000 Units Subcutaneous Q8H   hydrocortisone sod succinate (SOLU-CORTEF) inj  50 mg Intravenous Q8H   potassium chloride  40 mEq Oral Once   traZODone  100 mg Oral QHS   venlafaxine XR  150 mg Oral Q breakfast   Continuous Infusions:  cefTRIAXone (ROCEPHIN)  IV Stopped (11/22/21 0810)   promethazine (PHENERGAN) injection (IM or IVPB) Stopped (11/22/21 2203)     LOS: 3 days  Time spent= 35 mins    Braelyn Jenson Joline Maxcy, MD [facility]  If 7PM-7AM, please contact night-coverage  11/23/2021, 7:29 AM

## 2021-11-24 DIAGNOSIS — A419 Sepsis, unspecified organism: Secondary | ICD-10-CM | POA: Diagnosis not present

## 2021-11-24 DIAGNOSIS — N390 Urinary tract infection, site not specified: Secondary | ICD-10-CM | POA: Diagnosis not present

## 2021-11-24 LAB — GLUCOSE, CAPILLARY
Glucose-Capillary: 98 mg/dL (ref 70–99)
Glucose-Capillary: 89 mg/dL (ref 70–99)

## 2021-11-24 LAB — CBC
HCT: 29 % — ABNORMAL LOW (ref 36.0–46.0)
Hemoglobin: 9.7 g/dL — ABNORMAL LOW (ref 12.0–15.0)
MCH: 32.3 pg (ref 26.0–34.0)
MCHC: 33.4 g/dL (ref 30.0–36.0)
MCV: 96.7 fL (ref 80.0–100.0)
Platelets: 269 10*3/uL (ref 150–400)
RBC: 3 MIL/uL — ABNORMAL LOW (ref 3.87–5.11)
RDW: 14.5 % (ref 11.5–15.5)
WBC: 10 10*3/uL (ref 4.0–10.5)
nRBC: 0.5 % — ABNORMAL HIGH (ref 0.0–0.2)

## 2021-11-24 LAB — BASIC METABOLIC PANEL
Anion gap: 9 (ref 5–15)
BUN: 22 mg/dL (ref 8–23)
CO2: 26 mmol/L (ref 22–32)
Calcium: 8.3 mg/dL — ABNORMAL LOW (ref 8.9–10.3)
Chloride: 106 mmol/L (ref 98–111)
Creatinine, Ser: 1.54 mg/dL — ABNORMAL HIGH (ref 0.44–1.00)
GFR, Estimated: 38 mL/min — ABNORMAL LOW (ref >60)
Glucose, Bld: 119 mg/dL — ABNORMAL HIGH (ref 70–99)
Potassium: 3.4 mmol/L — ABNORMAL LOW (ref 3.5–5.1)
Sodium: 141 mmol/L (ref 135–145)

## 2021-11-24 LAB — MAGNESIUM: Magnesium: 2 mg/dL (ref 1.7–2.4)

## 2021-11-24 MED ORDER — FUROSEMIDE 10 MG/ML IJ SOLN
40.0000 mg | Freq: Two times a day (BID) | INTRAMUSCULAR | Status: AC
  Administered 2021-11-24 (×2): 40 mg via INTRAVENOUS
  Filled 2021-11-24 (×2): qty 4

## 2021-11-24 MED ORDER — POTASSIUM CHLORIDE 10 MEQ/100ML IV SOLN: 10.0000 meq | INTRAVENOUS | Status: DC

## 2021-11-24 MED ORDER — POTASSIUM CHLORIDE 20 MEQ PO PACK
40.0000 meq | PACK | Freq: Once | ORAL | Status: AC
  Administered 2021-11-24: 40 meq via ORAL
  Filled 2021-11-24: qty 2

## 2021-11-24 MED ORDER — LACTULOSE 10 GM/15ML PO SOLN
30.0000 g | Freq: Three times a day (TID) | ORAL | Status: DC
  Administered 2021-11-24 (×2): 30 g via ORAL
  Filled 2021-11-24 (×4): qty 45

## 2021-11-24 MED ORDER — SORBITOL 70 % SOLN
960.0000 mL | TOPICAL_OIL | Freq: Once | ORAL | Status: DC
  Filled 2021-11-24: qty 473

## 2021-11-24 MED ORDER — IPRATROPIUM-ALBUTEROL 0.5-2.5 (3) MG/3ML IN SOLN
3.0000 mL | Freq: Four times a day (QID) | RESPIRATORY_TRACT | Status: DC
  Administered 2021-11-24: 3 mL via RESPIRATORY_TRACT
  Filled 2021-11-24: qty 3

## 2021-11-24 MED ORDER — IPRATROPIUM-ALBUTEROL 0.5-2.5 (3) MG/3ML IN SOLN
3.0000 mL | Freq: Three times a day (TID) | RESPIRATORY_TRACT | Status: DC
  Administered 2021-11-24 – 2021-11-25 (×4): 3 mL via RESPIRATORY_TRACT
  Filled 2021-11-24 (×4): qty 3

## 2021-11-24 MED ORDER — PREDNISONE 20 MG PO TABS
40.0000 mg | ORAL_TABLET | Freq: Every day | ORAL | Status: DC
Start: 2021-11-24 — End: 2021-11-26
  Administered 2021-11-24 – 2021-11-26 (×3): 40 mg via ORAL
  Filled 2021-11-24 (×3): qty 2

## 2021-11-24 MED ORDER — BUDESONIDE 0.5 MG/2ML IN SUSP
0.5000 mg | Freq: Two times a day (BID) | RESPIRATORY_TRACT | Status: DC
  Administered 2021-11-24 – 2021-11-26 (×4): 0.5 mg via RESPIRATORY_TRACT
  Filled 2021-11-24 (×4): qty 2

## 2021-11-24 MED ORDER — ACETAMINOPHEN-CODEINE 300-30 MG PO TABS
1.0000 | ORAL_TABLET | Freq: Three times a day (TID) | ORAL | Status: DC | PRN
  Administered 2021-11-24 (×2): 1 via ORAL
  Filled 2021-11-24 (×2): qty 1

## 2021-11-24 MED ORDER — GUAIFENESIN ER 600 MG PO TB12
1200.0000 mg | ORAL_TABLET | Freq: Two times a day (BID) | ORAL | Status: DC
Start: 2021-11-24 — End: 2021-11-26
  Administered 2021-11-24 – 2021-11-26 (×5): 1200 mg via ORAL
  Filled 2021-11-24 (×5): qty 2

## 2021-11-24 NOTE — Progress Notes (Signed)
PROGRESS NOTE    [name]  WNI:[id number] DOB: 05-Jun-1955 DOA: 11/20/2021 PCP: [name], PA   Brief Narrative:  66 y.o. female with medical history significant of insomnia, depression, GERD, hot flashes, chronic lower back pain comes to the hospital with complaints of weakness, fevers and chills.  Patient was admitted for sepsis secondary to UTI, AKI and bilateral pyelonephritis.  Patient was started on IV fluids, empiric IV Rocephin.  Due to persistent hypotension despite IV fluids, initially patient also required midodrine, albumin and stress dose steroids.  But later due to signs of volume overload also received Lasix. Also concerns for reactive airways so started on bronchodilators and steroids.    Assessment & Plan:  Principal Problem:   Sepsis secondary to UTI [facility]) Active Problems:   Pyelonephritis   AKI (acute kidney injury) (HCC)      Severe sepsis secondary to acute bilateral pyelonephritis -Initially patient has shock physiology requiring midodrine and hydrocortisone which has not been taken off.  Cultures are growing pansensitive E. coli, on IV Rocephin. - CT abdomen pelvis reviewed-shows bilateral pyelonephritis, no evidence of renal stones/obstruction or hydronephrosis. Low threshold to broaden Abx.   Acute kidney injury, baseline 0.9.  Admission creatinine 1.8 -Creatinine still elevated at 1.95.  Renal function slowly improving but due to hypoperfusion there is suspicion for ATN.  Renal ultrasound is negative, creatinine today 1.54  Acute hypoxia, likely from volume overload. Bilateral wheezing - BNP is up as well.  Echocardiogram unremarkable, will give additional dose of IV Lasix today.  There is also concern for reactive airway therefore will add bronchodilators, steroids, I-S and flutter valve.  Slowly wean off oxygen.  Antitussives.   Hypomagnesemia/hypokalemia - Repletion   Insomnia -Continue home trazodone   GERD/depression -PPI as  needed.  Continue daily effexor      DVT prophylaxis: Subcu heparin Code Status: Full code Family Communication: Family at bedside  Status is: Inpatient Remains inpatient appropriate because: Once her respiratory symptoms have improved, discharge her home.  Hopefully next 24-48 hours.  Subjective: Patient tells me she has had significant coughing and shortness of breath with minimal ambulation.  Reports of some orthopnea, persistent coughing.  She also says walking down the hall she gets out of breath.  Examination: Constitutional: Not in acute distress Respiratory: Bilateral expiratory wheezing Cardiovascular: Normal sinus rhythm, no rubs Abdomen: Nontender nondistended good bowel sounds Musculoskeletal: No edema noted Skin: No rashes seen Neurologic: CN 2-12 grossly intact.  And nonfocal Psychiatric: Normal judgment and insight. Alert and oriented x 3. Normal mood. Objective: Vitals:   11/23/21 1953 11/24/21 0017 11/24/21 0446 11/24/21 1138  BP: 124/89 98/84    Pulse: 89 86    Resp: 16 18    Temp: 99.2 F (37.3 C) 98.4 F (36.9 C)    TempSrc: Oral Oral    SpO2: 95% 91%  95%  Weight:   74.4 kg   Height:        Intake/Output Summary (Last 24 hours) at 11/24/2021 1151 Last data filed at 11/24/2021 0700 Gross per 24 hour  Intake 482.9 ml  Output --  Net 482.9 ml   Filed Weights   11/22/21 0500 11/23/21 0500 11/24/21 0446  Weight: 74.4 kg 72.4 kg 74.4 kg     Data Reviewed:   CBC: Recent Labs  Lab 11/20/21 1622 11/21/21 0546 11/22/21 0242 11/23/21 0230 11/24/21 0447  WBC 14.3* 10.8* 8.1 8.4 10.0  HGB 10.2* 9.2* 8.5* 9.7* 9.7*  HCT 31.1* 29.7* 26.3* 29.0*  29.0*  MCV 99.7 104.2* 100.4* 97.0 96.7  PLT 138* 126* 133* 189 269   Basic Metabolic Panel: Recent Labs  Lab 11/20/21 0720 11/20/21 1622 11/21/21 0546 11/22/21 0242 11/23/21 0230 11/24/21 0447  NA 131*  --  136 139 141 141  K 3.5  --  4.1 3.8 3.4* 3.4*  CL 98  --  105 112* 108 106  CO2 23  --   22 19* 22 26  GLUCOSE 188*  --  136* 121* 150* 119*  BUN 16  --  23 20 19 22   CREATININE 1.83* 1.61* 1.95* 1.71* 1.60* 1.54*  CALCIUM 8.5*  --  7.5* 8.0* 8.3* 8.3*  MG  --   --  1.4* 2.7* 2.3 2.0   GFR: Estimated Creatinine Clearance: 36.1 mL/min (A) (by C-G formula based on SCr of 1.54 mg/dL (H)). Liver Function Tests: Recent Labs  Lab 11/20/21 0720  AST 52*  ALT 92*  ALKPHOS 110  BILITOT 1.9*  PROT 6.8  ALBUMIN 2.8*   Recent Labs  Lab 11/20/21 0720  LIPASE 22   No results for input(s): "AMMONIA" in the last 168 hours. Coagulation Profile: No results for input(s): "INR", "PROTIME" in the last 168 hours. Cardiac Enzymes: No results for input(s): "CKTOTAL", "CKMB", "CKMBINDEX", "TROPONINI" in the last 168 hours. BNP (last 3 results) No results for input(s): "PROBNP" in the last 8760 hours. HbA1C: No results for input(s): "HGBA1C" in the last 72 hours. CBG: Recent Labs  Lab 11/20/21 2029 11/22/21 0747 11/23/21 0727 11/24/21 0838 11/24/21 1138  GLUCAP 149* 106* 159* 98 89   Lipid Profile: No results for input(s): "CHOL", "HDL", "LDLCALC", "TRIG", "CHOLHDL", "LDLDIRECT" in the last 72 hours. Thyroid Function Tests: Recent Labs    11/22/21 0745  TSH 0.896   Anemia Panel: No results for input(s): "VITAMINB12", "FOLATE", "FERRITIN", "TIBC", "IRON", "RETICCTPCT" in the last 72 hours. Sepsis Labs: Recent Labs  Lab 11/20/21 0750 11/20/21 2032 11/21/21 2044  LATICACIDVEN 1.9 2.0* 1.8    Recent Results (from the past 240 hour(s))  Urine Culture     Status: Abnormal   Collection Time: 11/20/21  7:19 AM   Specimen: Urine, Clean Catch  Result Value Ref Range Status   Specimen Description   Final    URINE, CLEAN CATCH Performed at [facility], [address] [place], [place]    Special Requests   Final    NONE Performed at [facility], [address] [place], [place]    Culture >=100,000 COLONIES/mL ESCHERICHIA  COLI (A)  Final   Report Status 11/22/2021 FINAL  Final   Organism ID, Bacteria ESCHERICHIA COLI (A)  Final      Susceptibility   Escherichia coli - MIC*    AMPICILLIN <=2 SENSITIVE Sensitive     CEFAZOLIN <=4 SENSITIVE Sensitive     CEFEPIME <=0.12 SENSITIVE Sensitive     CEFTRIAXONE <=0.25 SENSITIVE Sensitive     CIPROFLOXACIN <=0.25 SENSITIVE Sensitive     GENTAMICIN <=1 SENSITIVE Sensitive     IMIPENEM <=0.25 SENSITIVE Sensitive     NITROFURANTOIN <=16 SENSITIVE Sensitive     TRIMETH/SULFA <=20 SENSITIVE Sensitive     AMPICILLIN/SULBACTAM <=2 SENSITIVE Sensitive     PIP/TAZO <=4 SENSITIVE Sensitive     * >=100,000 COLONIES/mL ESCHERICHIA COLI  Culture, blood (routine x 2)     Status: Abnormal   Collection Time: 11/20/21  7:20 AM   Specimen: BLOOD  Result Value Ref Range Status  Specimen Description   Final    BLOOD LEFT ANTECUBITAL Performed at [facility], [address] [place], [place] [address]    Special Requests   Final    BOTTLES DRAWN AEROBIC AND ANAEROBIC Blood Culture results may not be optimal due to an inadequate volume of blood received in culture bottles Performed at [facility], [address] [place], [place] [address]    Culture  Setup Time   Final    GRAM NEGATIVE RODS IN BOTH AEROBIC AND ANAEROBIC BOTTLES Organism ID to follow CRITICAL RESULT CALLED TO, READ BACK BY AND VERIFIED WITH: L POINDEXTER,[email or web address]  11/20/21 MK Performed at [facility], [address] [place], [place] [address]    Culture ESCHERICHIA COLI (A)  Final   Report Status 11/22/2021 FINAL  Final   Organism ID, Bacteria ESCHERICHIA COLI  Final      Susceptibility   Escherichia coli - MIC*    AMPICILLIN <=2 SENSITIVE Sensitive     CEFAZOLIN <=4 SENSITIVE Sensitive     CEFEPIME <=0.12 SENSITIVE Sensitive     CEFTAZIDIME <=1 SENSITIVE Sensitive     CEFTRIAXONE <=0.25 SENSITIVE Sensitive     CIPROFLOXACIN <=0.25 SENSITIVE Sensitive     GENTAMICIN  <=1 SENSITIVE Sensitive     IMIPENEM <=0.25 SENSITIVE Sensitive     TRIMETH/SULFA <=20 SENSITIVE Sensitive     AMPICILLIN/SULBACTAM <=2 SENSITIVE Sensitive     PIP/TAZO <=4 SENSITIVE Sensitive     * ESCHERICHIA COLI  Blood Culture ID Panel (Reflexed)     Status: Abnormal   Collection Time: 11/20/21  7:20 AM  Result Value Ref Range Status   Enterococcus faecalis NOT DETECTED NOT DETECTED Final   Enterococcus Faecium NOT DETECTED NOT DETECTED Final   Listeria monocytogenes NOT DETECTED NOT DETECTED Final   Staphylococcus species NOT DETECTED NOT DETECTED Final   Staphylococcus aureus (BCID) NOT DETECTED NOT DETECTED Final   Staphylococcus epidermidis NOT DETECTED NOT DETECTED Final   Staphylococcus lugdunensis NOT DETECTED NOT DETECTED Final   Streptococcus species NOT DETECTED NOT DETECTED Final   Streptococcus agalactiae NOT DETECTED NOT DETECTED Final   Streptococcus pneumoniae NOT DETECTED NOT DETECTED Final   Streptococcus pyogenes NOT DETECTED NOT DETECTED Final   A.calcoaceticus-baumannii NOT DETECTED NOT DETECTED Final   Bacteroides fragilis NOT DETECTED NOT DETECTED Final   Enterobacterales DETECTED (A) NOT DETECTED Final    Comment: Enterobacterales represent a large order of gram negative bacteria, not a single organism. CRITICAL RESULT CALLED TO, READ BACK BY AND VERIFIED WITH: L POINDEXTER,[email or web address]  11/20/21 MK    Enterobacter cloacae complex NOT DETECTED NOT DETECTED Final   Escherichia coli DETECTED (A) NOT DETECTED Final    Comment: CRITICAL RESULT CALLED TO, READ BACK BY AND VERIFIED WITH: L POINDEXTER,[email or web address]  11/20/21 MK    Klebsiella aerogenes NOT DETECTED NOT DETECTED Final   Klebsiella oxytoca NOT DETECTED NOT DETECTED Final   Klebsiella pneumoniae NOT DETECTED NOT DETECTED Final   Proteus species NOT DETECTED NOT DETECTED Final   Salmonella species NOT DETECTED NOT DETECTED Final   Serratia marcescens NOT DETECTED NOT DETECTED Final   Haemophilus  influenzae NOT DETECTED NOT DETECTED Final   Neisseria meningitidis NOT DETECTED NOT DETECTED Final   Pseudomonas aeruginosa NOT DETECTED NOT DETECTED Final   Stenotrophomonas maltophilia NOT DETECTED NOT DETECTED Final   Candida albicans NOT DETECTED NOT DETECTED Final   Candida auris NOT DETECTED NOT DETECTED Final   Candida glabrata NOT DETECTED NOT DETECTED Final  Candida krusei NOT DETECTED NOT DETECTED Final   Candida parapsilosis NOT DETECTED NOT DETECTED Final   Candida tropicalis NOT DETECTED NOT DETECTED Final   Cryptococcus neoformans/gattii NOT DETECTED NOT DETECTED Final   CTX-M ESBL NOT DETECTED NOT DETECTED Final   Carbapenem resistance IMP NOT DETECTED NOT DETECTED Final   Carbapenem resistance KPC NOT DETECTED NOT DETECTED Final   Carbapenem resistance NDM NOT DETECTED NOT DETECTED Final   Carbapenem resist OXA 48 LIKE NOT DETECTED NOT DETECTED Final   Carbapenem resistance VIM NOT DETECTED NOT DETECTED Final    Comment: Performed at [facility], [address], [place], [place] [address]  Culture, blood (routine x 2)     Status: Abnormal   Collection Time: 11/20/21  7:50 AM   Specimen: BLOOD  Result Value Ref Range Status   Specimen Description   Final    BLOOD RIGHT ANTECUBITAL Performed at [facility], [address] [place], [place] [address]    Special Requests   Final    BOTTLES DRAWN AEROBIC AND ANAEROBIC Blood Culture results may not be optimal due to an inadequate volume of blood received in culture bottles Performed at [facility], [address] [place], [place] [address]    Culture  Setup Time   Final    GRAM NEGATIVE RODS IN BOTH AEROBIC AND ANAEROBIC BOTTLES CRITICAL VALUE NOTED.  VALUE IS CONSISTENT WITH PREVIOUSLY REPORTED AND CALLED VALUE.    Culture (A)  Final    ESCHERICHIA COLI SUSCEPTIBILITIES PERFORMED ON PREVIOUS CULTURE WITHIN THE LAST 5 DAYS. Performed at [facility], [address],  [place], [place] [address]    Report Status 11/22/2021 FINAL  Final  MRSA Next Gen by PCR, Nasal     Status: None   Collection Time: 11/21/21  2:44 PM   Specimen: Nasal Mucosa; Nasal Swab  Result Value Ref Range Status   MRSA by PCR Next Gen NOT DETECTED NOT DETECTED Final    Comment: (NOTE) The GeneXpert MRSA Assay (FDA approved for NASAL specimens only), is one component of a comprehensive MRSA colonization surveillance program. It is not intended to diagnose MRSA infection nor to guide or monitor treatment for MRSA infections. Test performance is not FDA approved in patients less than 14 years old. Performed at [facility], [address] [place], [place] [address]          Radiology Studies: No results found.      Scheduled Meds:  docusate sodium  100 mg Oral BID   furosemide  40 mg Intravenous BID   guaiFENesin  1,200 mg Oral BID   heparin  5,000 Units Subcutaneous Q8H   ipratropium-albuterol  3 mL Nebulization TID   lactulose  30 g Oral TID   predniSONE  40 mg Oral Q breakfast   sorbitol, milk of mag, mineral oil, glycerin (SMOG) enema  960 mL Rectal Once   traZODone  100 mg Oral QHS   venlafaxine XR  150 mg Oral Q breakfast   Continuous Infusions:   ceFAZolin (ANCEF) IV 200 mL/hr at 11/24/21 0700   promethazine (PHENERGAN) injection (IM or IVPB) Stopped (11/24/21 8756)     LOS: 4 days   Time spent= 35 mins    Alwin Lanigan Joline Maxcy, MD [facility]  If 7PM-7AM, please contact night-coverage  11/24/2021, 11:51 AM

## 2021-11-24 NOTE — Plan of Care (Signed)
  Problem: Clinical Measurements: Goal: Will remain free from infection Outcome: Progressing   Problem: Clinical Measurements: Goal: Diagnostic test results will improve Outcome: Progressing   Problem: Clinical Measurements: Goal: Respiratory complications will improve Outcome: Progressing   Problem: Clinical Measurements: Goal: Ability to maintain clinical measurements within normal limits will improve Outcome: Progressing

## 2021-11-25 DIAGNOSIS — N12 Tubulo-interstitial nephritis, not specified as acute or chronic: Secondary | ICD-10-CM | POA: Diagnosis not present

## 2021-11-25 DIAGNOSIS — G4700 Insomnia, unspecified: Secondary | ICD-10-CM

## 2021-11-25 DIAGNOSIS — E669 Obesity, unspecified: Secondary | ICD-10-CM

## 2021-11-25 DIAGNOSIS — A419 Sepsis, unspecified organism: Secondary | ICD-10-CM | POA: Diagnosis not present

## 2021-11-25 DIAGNOSIS — N390 Urinary tract infection, site not specified: Secondary | ICD-10-CM | POA: Diagnosis not present

## 2021-11-25 DIAGNOSIS — K219 Gastro-esophageal reflux disease without esophagitis: Secondary | ICD-10-CM

## 2021-11-25 DIAGNOSIS — E876 Hypokalemia: Secondary | ICD-10-CM

## 2021-11-25 DIAGNOSIS — F32A Depression, unspecified: Secondary | ICD-10-CM

## 2021-11-25 DIAGNOSIS — N179 Acute kidney failure, unspecified: Secondary | ICD-10-CM | POA: Diagnosis not present

## 2021-11-25 DIAGNOSIS — R062 Wheezing: Secondary | ICD-10-CM

## 2021-11-25 DIAGNOSIS — E8770 Fluid overload, unspecified: Secondary | ICD-10-CM

## 2021-11-25 DIAGNOSIS — R7881 Bacteremia: Secondary | ICD-10-CM

## 2021-11-25 LAB — MAGNESIUM: Magnesium: 1.8 mg/dL (ref 1.7–2.4)

## 2021-11-25 LAB — BASIC METABOLIC PANEL
Anion gap: 13 (ref 5–15)
BUN: 20 mg/dL (ref 8–23)
CO2: 28 mmol/L (ref 22–32)
Calcium: 8.2 mg/dL — ABNORMAL LOW (ref 8.9–10.3)
Chloride: 103 mmol/L (ref 98–111)
Creatinine, Ser: 1.39 mg/dL — ABNORMAL HIGH (ref 0.44–1.00)
GFR, Estimated: 43 mL/min — ABNORMAL LOW (ref >60)
Glucose, Bld: 121 mg/dL — ABNORMAL HIGH (ref 70–99)
Potassium: 2.9 mmol/L — ABNORMAL LOW (ref 3.5–5.1)
Sodium: 144 mmol/L (ref 135–145)

## 2021-11-25 LAB — GLUCOSE, CAPILLARY: Glucose-Capillary: 84 mg/dL (ref 70–99)

## 2021-11-25 LAB — CBC
HCT: 30.9 % — ABNORMAL LOW (ref 36.0–46.0)
Hemoglobin: 10.3 g/dL — ABNORMAL LOW (ref 12.0–15.0)
MCH: 32.4 pg (ref 26.0–34.0)
MCHC: 33.3 g/dL (ref 30.0–36.0)
MCV: 97.2 fL (ref 80.0–100.0)
Platelets: 312 10*3/uL (ref 150–400)
RBC: 3.18 MIL/uL — ABNORMAL LOW (ref 3.87–5.11)
RDW: 14.4 % (ref 11.5–15.5)
WBC: 10.8 10*3/uL — ABNORMAL HIGH (ref 4.0–10.5)
nRBC: 0.5 % — ABNORMAL HIGH (ref 0.0–0.2)

## 2021-11-25 MED ORDER — ALUM & MAG HYDROXIDE-SIMETH 200-200-20 MG/5ML PO SUSP
30.0000 mL | ORAL | Status: DC | PRN
  Administered 2021-11-25: 30 mL via ORAL
  Filled 2021-11-25: qty 30

## 2021-11-25 MED ORDER — POTASSIUM CHLORIDE CRYS ER 20 MEQ PO TBCR
40.0000 meq | EXTENDED_RELEASE_TABLET | ORAL | Status: AC
  Administered 2021-11-25 (×2): 40 meq via ORAL
  Filled 2021-11-25 (×2): qty 2

## 2021-11-25 MED ORDER — CALCIUM CARBONATE ANTACID 500 MG PO CHEW
1.0000 | CHEWABLE_TABLET | Freq: Once | ORAL | Status: AC
  Administered 2021-11-25: 200 mg via ORAL
  Filled 2021-11-25: qty 1

## 2021-11-25 MED ORDER — ACETAMINOPHEN-CODEINE 300-30 MG PO TABS
1.0000 | ORAL_TABLET | Freq: Three times a day (TID) | ORAL | Status: DC | PRN
  Administered 2021-11-25 (×2): 1 via ORAL
  Filled 2021-11-25 (×2): qty 1

## 2021-11-25 MED ORDER — IPRATROPIUM-ALBUTEROL 0.5-2.5 (3) MG/3ML IN SOLN
3.0000 mL | Freq: Two times a day (BID) | RESPIRATORY_TRACT | Status: DC
  Administered 2021-11-26: 3 mL via RESPIRATORY_TRACT
  Filled 2021-11-25: qty 3

## 2021-11-25 MED ORDER — FUROSEMIDE 10 MG/ML IJ SOLN
40.0000 mg | Freq: Once | INTRAMUSCULAR | Status: AC
Start: 2021-11-25 — End: 2021-11-25
  Administered 2021-11-25: 40 mg via INTRAVENOUS
  Filled 2021-11-25: qty 4

## 2021-11-25 NOTE — Progress Notes (Signed)
PROGRESS NOTE    [name]  YQI:[id number] DOB: 12/04/1955 DOA: 11/20/2021 PCP: [name], PA   Brief Narrative:  66 y.o. female with medical history significant of insomnia, depression, GERD, chronic lower back pain presented with  weakness, fevers and chills.  Patient was admitted for sepsis secondary to UTI, AKI and bilateral pyelonephritis.  Patient was started on IV fluids, empiric IV Rocephin.  Due to persistent hypotension despite IV fluids, initially patient also required midodrine, albumin and stress dose steroids.  But later due to signs of volume overload also received Lasix.  She was subsequently also started on bronchodilators and steroids because of wheezing.  Assessment & Plan:   Severe sepsis/septic shock: Present on admission E. coli bacteremia and bilateral pyelonephritis -Initially required hydrocortisone and midodrine which have now been taken off. -Initially on Rocephin, currently on Ancef. -Hemodynamically improving.  Acute kidney injury -Improving.  1.39 today.  Repeat a.m. labs  Volume overload Wheezing -Echo unremarkable.  Received few doses of IV Lasix during the hospitalization.  Respiratory status improving.  Will give another dose of IV Lasix today. -Patient has been started on oral prednisone and nebs as well.  Hypokalemia -Replace.  Repeat a.m. labs  Hypomagnesemia -Improved  Obesity -Outpatient follow-up  Insomnia Continue home trazodone  GERD -Continue PPI as needed  Depression -Continue Effexor    DVT prophylaxis: Subcutaneous heparin Code Status: Full Family Communication: None at bedside Disposition Plan: Status is: Inpatient Remains inpatient appropriate because: Of severity of illness.  Need for IV diuretics   Consultants: PCCM  Procedures: Echo  Antimicrobials:  Anti-infectives (From admission, onward)    Start     Dose/Rate Route Frequency Ordered Stop   11/24/21 0600  ceFAZolin (ANCEF) IVPB 2g/100  mL premix        2 g 200 mL/hr over 30 Minutes Intravenous Every 8 hours 11/23/21 1121 11/27/21 0559   11/21/21 0800  cefTRIAXone (ROCEPHIN) 2 g in sodium chloride 0.9 % 100 mL IVPB  Status:  Discontinued        2 g 200 mL/hr over 30 Minutes Intravenous Every 24 hours 11/20/21 1531 11/23/21 1121   11/20/21 0745  cefTRIAXone (ROCEPHIN) 2 g in sodium chloride 0.9 % 100 mL IVPB  Status:  Discontinued        2 g 200 mL/hr over 30 Minutes Intravenous Every 24 hours 11/20/21 0736 11/20/21 1531        Subjective: Patient seen and examined at bedside.  Feels slightly better but still short of breath with exertion.  Does not feel ready to go home today.  No overnight fever, chest pain, vomiting reported.  Objective: Vitals:   11/24/21 1927 11/25/21 0308 11/25/21 0500 11/25/21 0744  BP: 105/73 135/84    Pulse: 80 73    Resp: (!) 21 16    Temp: 97.9 F (36.6 C) (!) 97.3 F (36.3 C)    TempSrc: Oral Oral    SpO2: 96% 95%  96%  Weight:   79.9 kg   Height:       No intake or output data in the 24 hours ending 11/25/21 1105 Filed Weights   11/23/21 0500 11/24/21 0446 11/25/21 0500  Weight: 72.4 kg 74.4 kg 79.9 kg    Examination:  General exam: Appears calm and comfortable.  Currently on room air. Respiratory system: Bilateral decreased breath sounds at bases with some scattered crackles Cardiovascular system: S1 & S2 heard, Rate controlled Gastrointestinal system: Abdomen is obese, nondistended, soft and nontender. Normal  bowel sounds heard. Extremities: No cyanosis, clubbing; trace lower extremity edema  Central nervous system: Alert and oriented. No focal neurological deficits. Moving extremities Skin: No rashes, lesions or ulcers Psychiatry: Judgement and insight appear normal. Mood & affect appropriate.     Data Reviewed: I have personally reviewed following labs and imaging studies  CBC: Recent Labs  Lab 11/21/21 0546 11/22/21 0242 11/23/21 0230 11/24/21 0447  11/25/21 0512  WBC 10.8* 8.1 8.4 10.0 10.8*  HGB 9.2* 8.5* 9.7* 9.7* 10.3*  HCT 29.7* 26.3* 29.0* 29.0* 30.9*  MCV 104.2* 100.4* 97.0 96.7 97.2  PLT 126* 133* 189 269 312   Basic Metabolic Panel: Recent Labs  Lab 11/21/21 0546 11/22/21 0242 11/23/21 0230 11/24/21 0447 11/25/21 0512  NA 136 139 141 141 144  K 4.1 3.8 3.4* 3.4* 2.9*  CL 105 112* 108 106 103  CO2 22 19* 22 26 28   GLUCOSE 136* 121* 150* 119* 121*  BUN 23 20 19 22 20   CREATININE 1.95* 1.71* 1.60* 1.54* 1.39*  CALCIUM 7.5* 8.0* 8.3* 8.3* 8.2*  MG 1.4* 2.7* 2.3 2.0 1.8   GFR: Estimated Creatinine Clearance: 41.5 mL/min (A) (by C-G formula based on SCr of 1.39 mg/dL (H)). Liver Function Tests: Recent Labs  Lab 11/20/21 0720  AST 52*  ALT 92*  ALKPHOS 110  BILITOT 1.9*  PROT 6.8  ALBUMIN 2.8*   Recent Labs  Lab 11/20/21 0720  LIPASE 22   No results for input(s): "AMMONIA" in the last 168 hours. Coagulation Profile: No results for input(s): "INR", "PROTIME" in the last 168 hours. Cardiac Enzymes: No results for input(s): "CKTOTAL", "CKMB", "CKMBINDEX", "TROPONINI" in the last 168 hours. BNP (last 3 results) No results for input(s): "PROBNP" in the last 8760 hours. HbA1C: No results for input(s): "HGBA1C" in the last 72 hours. CBG: Recent Labs  Lab 11/22/21 0747 11/23/21 0727 11/24/21 0838 11/24/21 1138 11/25/21 0805  GLUCAP 106* 159* 98 89 84   Lipid Profile: No results for input(s): "CHOL", "HDL", "LDLCALC", "TRIG", "CHOLHDL", "LDLDIRECT" in the last 72 hours. Thyroid Function Tests: No results for input(s): "TSH", "T4TOTAL", "FREET4", "T3FREE", "THYROIDAB" in the last 72 hours. Anemia Panel: No results for input(s): "VITAMINB12", "FOLATE", "FERRITIN", "TIBC", "IRON", "RETICCTPCT" in the last 72 hours. Sepsis Labs: Recent Labs  Lab 11/20/21 0750 11/20/21 2032 11/21/21 2044  LATICACIDVEN 1.9 2.0* 1.8    Recent Results (from the past 240 hour(s))  Urine Culture     Status: Abnormal    Collection Time: 11/20/21  7:19 AM   Specimen: Urine, Clean Catch  Result Value Ref Range Status   Specimen Description   Final    URINE, CLEAN CATCH Performed at [facility], [address] [place], [facility] [place]    Special Requests   Final    NONE Performed at [facility], [address] [place], [facility] [place]    Culture >=100,000 COLONIES/mL ESCHERICHIA COLI (A)  Final   Report Status 11/22/2021 FINAL  Final   Organism ID, Bacteria ESCHERICHIA COLI (A)  Final      Susceptibility   Escherichia coli - MIC*    AMPICILLIN <=2 SENSITIVE Sensitive     CEFAZOLIN <=4 SENSITIVE Sensitive     CEFEPIME <=0.12 SENSITIVE Sensitive     CEFTRIAXONE <=0.25 SENSITIVE Sensitive     CIPROFLOXACIN <=0.25 SENSITIVE Sensitive     GENTAMICIN <=1 SENSITIVE Sensitive     IMIPENEM <=0.25 SENSITIVE Sensitive     NITROFURANTOIN <=16 SENSITIVE Sensitive  TRIMETH/SULFA <=20 SENSITIVE Sensitive     AMPICILLIN/SULBACTAM <=2 SENSITIVE Sensitive     PIP/TAZO <=4 SENSITIVE Sensitive     * >=100,000 COLONIES/mL ESCHERICHIA COLI  Culture, blood (routine x 2)     Status: Abnormal   Collection Time: 11/20/21  7:20 AM   Specimen: BLOOD  Result Value Ref Range Status   Specimen Description   Final    BLOOD LEFT ANTECUBITAL Performed at [facility], [address] [place], [place] [address]    Special Requests   Final    BOTTLES DRAWN AEROBIC AND ANAEROBIC Blood Culture results may not be optimal due to an inadequate volume of blood received in culture bottles Performed at [facility], [address] [place], [place] [address]    Culture  Setup Time   Final    GRAM NEGATIVE RODS IN BOTH AEROBIC AND ANAEROBIC BOTTLES Organism ID to follow CRITICAL RESULT CALLED TO, READ BACK BY AND VERIFIED WITH: L POINDEXTER,[email or web address]  11/20/21 MK Performed at [facility], [address], [place], [place] [address]    Culture ESCHERICHIA COLI (A)   Final   Report Status 11/22/2021 FINAL  Final   Organism ID, Bacteria ESCHERICHIA COLI  Final      Susceptibility   Escherichia coli - MIC*    AMPICILLIN <=2 SENSITIVE Sensitive     CEFAZOLIN <=4 SENSITIVE Sensitive     CEFEPIME <=0.12 SENSITIVE Sensitive     CEFTAZIDIME <=1 SENSITIVE Sensitive     CEFTRIAXONE <=0.25 SENSITIVE Sensitive     CIPROFLOXACIN <=0.25 SENSITIVE Sensitive     GENTAMICIN <=1 SENSITIVE Sensitive     IMIPENEM <=0.25 SENSITIVE Sensitive     TRIMETH/SULFA <=20 SENSITIVE Sensitive     AMPICILLIN/SULBACTAM <=2 SENSITIVE Sensitive     PIP/TAZO <=4 SENSITIVE Sensitive     * ESCHERICHIA COLI  Blood Culture ID Panel (Reflexed)     Status: Abnormal   Collection Time: 11/20/21  7:20 AM  Result Value Ref Range Status   Enterococcus faecalis NOT DETECTED NOT DETECTED Final   Enterococcus Faecium NOT DETECTED NOT DETECTED Final   Listeria monocytogenes NOT DETECTED NOT DETECTED Final   Staphylococcus species NOT DETECTED NOT DETECTED Final   Staphylococcus aureus (BCID) NOT DETECTED NOT DETECTED Final   Staphylococcus epidermidis NOT DETECTED NOT DETECTED Final   Staphylococcus lugdunensis NOT DETECTED NOT DETECTED Final   Streptococcus species NOT DETECTED NOT DETECTED Final   Streptococcus agalactiae NOT DETECTED NOT DETECTED Final   Streptococcus pneumoniae NOT DETECTED NOT DETECTED Final   Streptococcus pyogenes NOT DETECTED NOT DETECTED Final   A.calcoaceticus-baumannii NOT DETECTED NOT DETECTED Final   Bacteroides fragilis NOT DETECTED NOT DETECTED Final   Enterobacterales DETECTED (A) NOT DETECTED Final    Comment: Enterobacterales represent a large order of gram negative bacteria, not a single organism. CRITICAL RESULT CALLED TO, READ BACK BY AND VERIFIED WITH: L POINDEXTER,[email or web address]  11/20/21 MK    Enterobacter cloacae complex NOT DETECTED NOT DETECTED Final   Escherichia coli DETECTED (A) NOT DETECTED Final    Comment: CRITICAL RESULT CALLED TO, READ BACK  BY AND VERIFIED WITH: L POINDEXTER,[email or web address]  11/20/21 MK    Klebsiella aerogenes NOT DETECTED NOT DETECTED Final   Klebsiella oxytoca NOT DETECTED NOT DETECTED Final   Klebsiella pneumoniae NOT DETECTED NOT DETECTED Final   Proteus species NOT DETECTED NOT DETECTED Final   Salmonella species NOT DETECTED NOT DETECTED Final   Serratia marcescens NOT DETECTED NOT DETECTED Final   Haemophilus influenzae  NOT DETECTED NOT DETECTED Final   Neisseria meningitidis NOT DETECTED NOT DETECTED Final   Pseudomonas aeruginosa NOT DETECTED NOT DETECTED Final   Stenotrophomonas maltophilia NOT DETECTED NOT DETECTED Final   Candida albicans NOT DETECTED NOT DETECTED Final   Candida auris NOT DETECTED NOT DETECTED Final   Candida glabrata NOT DETECTED NOT DETECTED Final   Candida krusei NOT DETECTED NOT DETECTED Final   Candida parapsilosis NOT DETECTED NOT DETECTED Final   Candida tropicalis NOT DETECTED NOT DETECTED Final   Cryptococcus neoformans/gattii NOT DETECTED NOT DETECTED Final   CTX-M ESBL NOT DETECTED NOT DETECTED Final   Carbapenem resistance IMP NOT DETECTED NOT DETECTED Final   Carbapenem resistance KPC NOT DETECTED NOT DETECTED Final   Carbapenem resistance NDM NOT DETECTED NOT DETECTED Final   Carbapenem resist OXA 48 LIKE NOT DETECTED NOT DETECTED Final   Carbapenem resistance VIM NOT DETECTED NOT DETECTED Final    Comment: Performed at [facility], [address], [place], [place] [address]  Culture, blood (routine x 2)     Status: Abnormal   Collection Time: 11/20/21  7:50 AM   Specimen: BLOOD  Result Value Ref Range Status   Specimen Description   Final    BLOOD RIGHT ANTECUBITAL Performed at [facility], [address] [place], [place] [address]    Special Requests   Final    BOTTLES DRAWN AEROBIC AND ANAEROBIC Blood Culture results may not be optimal due to an inadequate volume of blood received in culture bottles Performed at [facility],  [address] [place], [place] [address]    Culture  Setup Time   Final    GRAM NEGATIVE RODS IN BOTH AEROBIC AND ANAEROBIC BOTTLES CRITICAL VALUE NOTED.  VALUE IS CONSISTENT WITH PREVIOUSLY REPORTED AND CALLED VALUE.    Culture (A)  Final    ESCHERICHIA COLI SUSCEPTIBILITIES PERFORMED ON PREVIOUS CULTURE WITHIN THE LAST 5 DAYS. Performed at [facility], [address], [place], [place] [address]    Report Status 11/22/2021 FINAL  Final  MRSA Next Gen by PCR, Nasal     Status: None   Collection Time: 11/21/21  2:44 PM   Specimen: Nasal Mucosa; Nasal Swab  Result Value Ref Range Status   MRSA by PCR Next Gen NOT DETECTED NOT DETECTED Final    Comment: (NOTE) The GeneXpert MRSA Assay (FDA approved for NASAL specimens only), is one component of a comprehensive MRSA colonization surveillance program. It is not intended to diagnose MRSA infection nor to guide or monitor treatment for MRSA infections. Test performance is not FDA approved in patients less than 51 years old. Performed at [facility], [address], [place], [place] [address]          Radiology Studies: No results found.      Scheduled Meds:  budesonide (PULMICORT) nebulizer solution  0.5 mg Nebulization BID   docusate sodium  100 mg Oral BID   guaiFENesin  1,200 mg Oral BID   heparin  5,000 Units Subcutaneous Q8H   ipratropium-albuterol  3 mL Nebulization TID   lactulose  30 g Oral TID   potassium chloride  40 mEq Oral Q4H   predniSONE  40 mg Oral Q breakfast   traZODone  100 mg Oral QHS   venlafaxine XR  150 mg Oral Q breakfast   Continuous Infusions:   ceFAZolin (ANCEF) IV 2 g (11/25/21 0524)   promethazine (PHENERGAN) injection (IM or  IVPB) Stopped (11/24/21 [id number])          Glade LloydKshitiz Samaa Ueda, MD [facility] 11/25/2021, 11:05 AM

## 2021-11-26 DIAGNOSIS — R7881 Bacteremia: Secondary | ICD-10-CM | POA: Diagnosis not present

## 2021-11-26 DIAGNOSIS — N10 Acute tubulo-interstitial nephritis: Secondary | ICD-10-CM

## 2021-11-26 DIAGNOSIS — A419 Sepsis, unspecified organism: Secondary | ICD-10-CM | POA: Diagnosis not present

## 2021-11-26 DIAGNOSIS — R062 Wheezing: Secondary | ICD-10-CM

## 2021-11-26 DIAGNOSIS — N179 Acute kidney failure, unspecified: Secondary | ICD-10-CM | POA: Diagnosis not present

## 2021-11-26 DIAGNOSIS — N12 Tubulo-interstitial nephritis, not specified as acute or chronic: Secondary | ICD-10-CM | POA: Diagnosis not present

## 2021-11-26 DIAGNOSIS — A4151 Sepsis due to Escherichia coli [E. coli]: Principal | ICD-10-CM

## 2021-11-26 DIAGNOSIS — E8770 Fluid overload, unspecified: Secondary | ICD-10-CM

## 2021-11-26 DIAGNOSIS — R6521 Severe sepsis with septic shock: Secondary | ICD-10-CM

## 2021-11-26 DIAGNOSIS — E876 Hypokalemia: Secondary | ICD-10-CM

## 2021-11-26 LAB — BASIC METABOLIC PANEL
Anion gap: 10 (ref 5–15)
BUN: 21 mg/dL (ref 8–23)
CO2: 33 mmol/L — ABNORMAL HIGH (ref 22–32)
Calcium: 8.8 mg/dL — ABNORMAL LOW (ref 8.9–10.3)
Chloride: 105 mmol/L (ref 98–111)
Creatinine, Ser: 1.21 mg/dL — ABNORMAL HIGH (ref 0.44–1.00)
GFR, Estimated: 50 mL/min — ABNORMAL LOW (ref >60)
Glucose, Bld: 112 mg/dL — ABNORMAL HIGH (ref 70–99)
Potassium: 3.1 mmol/L — ABNORMAL LOW (ref 3.5–5.1)
Sodium: 148 mmol/L — ABNORMAL HIGH (ref 135–145)

## 2021-11-26 LAB — CBC
HCT: 31.5 % — ABNORMAL LOW (ref 36.0–46.0)
Hemoglobin: 10.5 g/dL — ABNORMAL LOW (ref 12.0–15.0)
MCH: 32.4 pg (ref 26.0–34.0)
MCHC: 33.3 g/dL (ref 30.0–36.0)
MCV: 97.2 fL (ref 80.0–100.0)
Platelets: 377 10*3/uL (ref 150–400)
RBC: 3.24 MIL/uL — ABNORMAL LOW (ref 3.87–5.11)
RDW: 14.4 % (ref 11.5–15.5)
WBC: 12.4 10*3/uL — ABNORMAL HIGH (ref 4.0–10.5)
nRBC: 0 % (ref 0.0–0.2)

## 2021-11-26 LAB — MAGNESIUM: Magnesium: 2.4 mg/dL (ref 1.7–2.4)

## 2021-11-26 LAB — GLUCOSE, CAPILLARY: Glucose-Capillary: 87 mg/dL (ref 70–99)

## 2021-11-26 MED ORDER — SIMETHICONE 80 MG PO CHEW
80.0000 mg | CHEWABLE_TABLET | Freq: Four times a day (QID) | ORAL | Status: DC | PRN
Start: 2021-11-26 — End: 2021-11-26
  Administered 2021-11-26: 80 mg via ORAL
  Filled 2021-11-26: qty 1

## 2021-11-26 MED ORDER — CEFADROXIL 1 G PO TABS: 1.0000 g | ORAL_TABLET | Freq: Two times a day (BID) | ORAL | 0 refills | Status: AC

## 2021-11-26 MED ORDER — ALBUTEROL SULFATE HFA 108 (90 BASE) MCG/ACT IN AERS: 2.0000 | INHALATION_SPRAY | RESPIRATORY_TRACT | 0 refills | Status: AC | PRN

## 2021-11-26 MED ORDER — POTASSIUM CHLORIDE CRYS ER 20 MEQ PO TBCR
40.0000 meq | EXTENDED_RELEASE_TABLET | ORAL | Status: AC
  Administered 2021-11-26 (×2): 40 meq via ORAL
  Filled 2021-11-26 (×2): qty 2

## 2021-11-26 MED ORDER — PREDNISONE 20 MG PO TABS: 40.0000 mg | ORAL_TABLET | Freq: Every day | ORAL | 0 refills | Status: AC

## 2021-11-26 NOTE — Discharge Summary (Signed)
Physician Discharge Summary  [name] STM:[id number] DOB: [birth year] DOA: 11/20/2021  PCP: Brittney Pardon, PA  Admit date: 11/20/2021 Discharge date: 11/26/2021  Admitted From: Home Disposition: Home  Recommendations for Outpatient Follow-up:  Follow up with PCP in 1 week with repeat CBC/BMP Follow up in ED if symptoms worsen or new appear   Home Health: No Equipment/Devices: None  Discharge Condition: Stable CODE STATUS: Full Diet recommendation: Heart healthy  Brief/Interim Summary: 66 y.o. female with medical history significant of insomnia, depression, GERD, chronic lower back pain presented with  weakness, fevers and chills.  Patient was admitted for sepsis secondary to UTI, AKI and bilateral pyelonephritis.  Patient was started on IV fluids, empiric IV Rocephin.  Due to persistent hypotension despite IV fluids, initially patient also required midodrine, albumin and stress dose steroids.  But later due to signs of volume overload also received Lasix.  She was subsequently also started on bronchodilators and steroids because of wheezing.  Subsequently, her condition has much improved.  Currently tolerating diet and feels much better to go home.  She will be discharged home today on oral antibiotics.    Discharge Diagnoses:   Severe sepsis/septic shock: Present on admission E. coli bacteremia and bilateral pyelonephritis -Initially required hydrocortisone and midodrine which have now been taken off. -Initially on Rocephin, currently on Ancef. -Hemodynamically improving. -Shock has resolved -Tolerating diet and wants to go home today.  She will be discharged home on oral cefadroxil to complete 2-week course of therapy.   Acute kidney injury -Improving.  1.21 today.  Outpatient follow-up with PCP within a week.  Volume overload Wheezing -Echo unremarkable.  Received few doses of IV Lasix during the hospitalization.  Respiratory status much improved. -Patient has  been started on oral prednisone and nebs as well. -Discharged home on oral prednisone 40 mg daily for 5 more days along with as needed albuterol  Hypokalemia -Replace prior to discharge.  Outpatient follow-up.  Hypomagnesemia -Improved   Obesity -Outpatient follow-up   Insomnia -Continue home trazodone  GERD -Continue PPI as needed   Depression -Continue Effexor  Discharge Instructions  Discharge Instructions     Diet - low sodium heart healthy   Complete by: As directed    Increase activity slowly   Complete by: As directed       Allergies as of 11/26/2021       Reactions   Latex Hives   Hydromorphone Hcl Other (See Comments)    hallucinations   Oxycodone-acetaminophen Nausea Only        Medication List     STOP taking these medications    ALPRAZolam 0.25 MG tablet Commonly known as: Xanax   ibuprofen 200 MG tablet Commonly known as: ADVIL   naproxen 500 MG tablet Commonly known as: NAPROSYN       TAKE these medications    acetaminophen 325 MG tablet Commonly known as: TYLENOL Take 650 mg by mouth daily as needed (pain).   albuterol 108 (90 Base) MCG/ACT inhaler Commonly known as: VENTOLIN HFA Inhale 2 puffs into the lungs every 4 (four) hours as needed for wheezing or shortness of breath.   BIOTIN PO Take 2 tablets by mouth daily.   CALCIUM-VITAMINS C & D PO Take 3 tablets by mouth daily.   cefadroxil 1 g tablet Commonly known as: DURICEF Take 1 tablet (1 g total) by mouth 2 (two) times daily for 8 days.   multivitamin with minerals tablet Take 2 tablets by mouth daily.  pantoprazole 40 MG tablet Commonly known as: PROTONIX Take 40 mg by mouth as needed (acid reflux).   predniSONE 20 MG tablet Commonly known as: DELTASONE Take 2 tablets (40 mg total) by mouth daily with breakfast for 5 days. Start taking on: November 27, 2021   traZODone 100 MG tablet Commonly known as: DESYREL Take 100 mg by mouth at bedtime.   TUMS  PO Take 2 tablets by mouth as needed (acid reflux).   venlafaxine XR 150 MG 24 hr capsule Commonly known as: EFFEXOR-XR Take 150 mg by mouth daily with breakfast.          Follow-up Information     Brittney Banker, PA. Schedule an appointment as soon as possible for a visit in 1 week(s).   Specialty: Physician Assistant Why: with repeat cbc/bmp Contact information: [address] [place] [address] [phone]                Allergies  Allergen Reactions   Latex Hives   Hydromorphone Hcl Other (See Comments)     hallucinations   Oxycodone-Acetaminophen Nausea Only    Consultations: PCCM   Procedures/Studies: US RENAL  Result Date: 11/22/2021 CLINICAL DATA:  Acute kidney injury. EXAM: RENAL / URINARY TRACT ULTRASOUND COMPLETE COMPARISON:  Abdominopelvic CT 11/20/2021. FINDINGS: Right Kidney: Renal measurements: 12.8 x 4.9 x 6.5 cm = volume: 214.2 mL. Echogenicity within normal limits. No mass or hydronephrosis visualized. Left Kidney: Renal measurements: 11.1 x 5.8 x 5.4 cm = volume: 181.8 mL. Echogenicity within normal limits. No mass or hydronephrosis visualized. Possible minimal perinephric fluid along the lower pole. Bladder: Decompressed by Foley catheter. Other: Examination limited by body habitus and inability to suspend respiration. IMPRESSION: 1. Both kidneys are normal in size without hydronephrosis. 2. No focal renal abnormalities are identified. Small amount of perinephric fluid on the left. More obvious perinephric soft tissue stranding bilaterally on recent CT is nonspecific and could reflect renal inflammatory disease or acute tubular necrosis. 3. Decompressed urinary bladder. Electronically Signed   By: Richardean Sale M.D.   On: 11/22/2021 10:17   ECHOCARDIOGRAM COMPLETE  Result Date: 11/22/2021    ECHOCARDIOGRAM REPORT   Patient Name:   [name] Games Date of Exam: 11/22/2021 Medical Rec #:  [id number]       Height:       63.0 in  Accession #:    [id number]      Weight:       164.0 lb Date of Birth:  04/18/1956       BSA:          1.777 m Patient Age:    66 years        BP:           99/80 mmHg Patient Gender: F               HR:           90 bpm. Exam Location:  Inpatient Procedure: 2D Echo, Cardiac Doppler and Color Doppler Indications:    Dyspnea  History:        Patient has no prior history of Echocardiogram examinations.  Sonographer:    Jefferey Pica Referring Phys: [place]  1. Left ventricular ejection fraction, by estimation, is 55 to 60%. The left ventricle has normal function. The left ventricle has no regional wall motion abnormalities. Left ventricular diastolic parameters were normal. There is the interventricular septum is flattened in systole and diastole, consistent with  right ventricular pressure and volume overload.  2. Right ventricular systolic function is normal. The right ventricular size is mildly enlarged. There is mildly elevated pulmonary artery systolic pressure.  3. Right atrial size was mildly dilated.  4. The mitral valve is normal in structure. Mild mitral valve regurgitation. No evidence of mitral stenosis.  5. Tricuspid valve regurgitation is moderate.  6. The aortic valve is tricuspid. Aortic valve regurgitation is not visualized. No aortic stenosis is present.  7. The inferior vena cava is dilated in size with <50% respiratory variability, suggesting right atrial pressure of 15 mmHg. Comparison(s): No prior Echocardiogram. FINDINGS  Left Ventricle: Left ventricular ejection fraction, by estimation, is 55 to 60%. The left ventricle has normal function. The left ventricle has no regional wall motion abnormalities. The left ventricular internal cavity size was normal in size. There is  no left ventricular hypertrophy. The interventricular septum is flattened in systole and diastole, consistent with right ventricular pressure and volume overload. Left ventricular diastolic  parameters were normal. Right Ventricle: The right ventricular size is mildly enlarged. Right ventricular systolic function is normal. There is mildly elevated pulmonary artery systolic pressure. The tricuspid regurgitant velocity is 2.51 m/s, and with an assumed right atrial pressure of 15 mmHg, the estimated right ventricular systolic pressure is [id number] mmHg. Left Atrium: Left atrial size was normal in size. Right Atrium: Right atrial size was mildly dilated. Pericardium: There is no evidence of pericardial effusion. Mitral Valve: The mitral valve is normal in structure. Mild mitral valve regurgitation. No evidence of mitral valve stenosis. Tricuspid Valve: The tricuspid valve is normal in structure. Tricuspid valve regurgitation is moderate . No evidence of tricuspid stenosis. Aortic Valve: The aortic valve is tricuspid. Aortic valve regurgitation is not visualized. No aortic stenosis is present. Aortic valve peak gradient measures 9.8 mmHg. Pulmonic Valve: The pulmonic valve was normal in structure. Pulmonic valve regurgitation is trivial. No evidence of pulmonic stenosis. Aorta: The aortic root is normal in size and structure. Venous: The inferior vena cava is dilated in size with less than 50% respiratory variability, suggesting right atrial pressure of 15 mmHg. IAS/Shunts: There is left bowing of the interatrial septum, suggestive of elevated right atrial pressure. No atrial level shunt detected by color flow Doppler.  LEFT VENTRICLE PLAX 2D LVIDd:         4.30 cm   Diastology LVIDs:         3.20 cm   LV e' medial:    7.97 cm/s LV PW:         1.00 cm   LV E/e' medial:  10.5 LV IVS:        1.00 cm   LV e' lateral:   11.70 cm/s LVOT diam:     1.90 cm   LV E/e' lateral: 7.1 LV SV:         52 LV SV Index:   29 LVOT Area:     2.84 cm  RIGHT VENTRICLE             IVC RV Basal diam:  3.70 cm     IVC diam: 2.60 cm RV Mid diam:    3.60 cm RV S prime:     16.70 cm/s TAPSE (M-mode): 1.5 cm LEFT ATRIUM             Index         RIGHT ATRIUM           Index LA diam:  4.10 cm 2.31 cm/m   RA Area:     20.10 cm LA Vol (A2C):   56.8 ml 31.96 ml/m  RA Volume:   63.10 ml  35.50 ml/m LA Vol (A4C):   45.3 ml 25.49 ml/m LA Biplane Vol: 53.7 ml 30.21 ml/m  AORTIC VALVE                 PULMONIC VALVE AV Area (Vmax): 2.32 cm     PV Vmax:       0.73 m/s AV Vmax:        156.50 cm/s  PV Peak grad:  2.1 mmHg AV Peak Grad:   9.8 mmHg LVOT Vmax:      128.00 cm/s LVOT Vmean:     68.200 cm/s LVOT VTI:       0.183 m  AORTA Ao Root diam: 2.80 cm Ao Asc diam:  3.10 cm MITRAL VALVE               TRICUSPID VALVE MV Area (PHT): 5.01 cm    TR Peak grad:   25.2 mmHg MV Decel Time: 152 msec    TR Vmax:        251.00 cm/s MR Peak grad: 82.3 mmHg MR Vmax:      453.50 cm/s  SHUNTS MV E velocity: 83.30 cm/s  Systemic VTI:  0.18 m MV A velocity: 58.70 cm/s  Systemic Diam: 1.90 cm MV E/A ratio:  1.42 Kirk Ruths MD Electronically signed by Kirk Ruths MD Signature Date/Time: 11/22/2021/9:49:19 AM    Final    DG CHEST PORT 1 VIEW  Result Date: 11/22/2021 CLINICAL DATA:  Dyspnea. EXAM: PORTABLE CHEST 1 VIEW COMPARISON:  Chest radiograph yesterday. FINDINGS: Low lung volumes. Mild cardiomegaly. Bronchovascular crowding versus vascular congestion. There may be small developing pleural effusions. No pneumothorax. IMPRESSION: Cardiomegaly with bronchovascular crowding versus vascular congestion. Possible developing small pleural effusions. Electronically Signed   By: Keith Rake M.D.   On: 11/22/2021 00:21   DG Chest Port 1 View  Result Date: 11/20/2021 CLINICAL DATA:  cough flank pain EXAM: PORTABLE CHEST 1 VIEW COMPARISON:  None Available. FINDINGS: The heart size and mediastinal contours are within normal limits. Both lungs are clear without consolidation, pleural effusion or vascular congestion. The visualized skeletal structures are unremarkable. Status post ACDF changes in the visualized lower cervical spine. IMPRESSION: No active disease.  Electronically Signed   By: Frazier Richards M.D.   On: 11/20/2021 09:08   CT Renal Stone Study  Result Date: 11/20/2021 CLINICAL DATA:  Flank pain, kidney stone suspected EXAM: CT ABDOMEN AND PELVIS WITHOUT CONTRAST TECHNIQUE: Multidetector CT imaging of the abdomen and pelvis was performed following the standard protocol without IV contrast. RADIATION DOSE REDUCTION: This exam was performed according to the departmental dose-optimization program which includes automated exposure control, adjustment of the mA and/or kV according to patient size and/or use of iterative reconstruction technique. COMPARISON:  CT 06/19/2008. FINDINGS: Lower chest: Bibasilar hypoventilatory changes. No acute abnormality. Hepatobiliary: No focal liver abnormality is seen. The gallbladder is unremarkable. Pancreas: Unremarkable. No pancreatic ductal dilatation or surrounding inflammatory changes. Spleen: Normal in size without focal abnormality. Adrenals/Urinary Tract: Adrenal glands are unremarkable. Edematous appearance of the kidneys with perinephric and periureteral stranding bilaterally but no hydronephrosis or nephroureterolithiasis identified. No perinephric fluid collection identified. The bladder is mildly distended. Stomach/Bowel: The stomach is within normal limits. There is no evidence of bowel obstruction.The appendix is not definitively identified and is either decompressed or surgically absent. Vascular/Lymphatic: No  significant vascular findings are present. No enlarged abdominal or pelvic lymph nodes. Reproductive: Small calcified in fibroids and subserosal/pedunculated fibroid posteriorly. Other: No abdominal wall hernia or abnormality. No abdominopelvic ascites. Musculoskeletal: No acute osseous abnormality. Bilateral hip arthroplasties with associated streak artifact. Degenerative disc disease with disc bulging, bilateral facet arthropathy, grade 1 anterolisthesis at L4-L5. IMPRESSION: Edematous appearance of the  kidneys with bilateral perinephric and periureteral stranding but no hydronephrosis or nephroureterolithiasis identified. Findings are concerning for bilateral pyelonephritis. Recommend correlation with urinalysis. Electronically Signed   By: Caprice Renshaw M.D.   On: 11/20/2021 09:08      Subjective: Patient seen and examined at bedside.  Complains of some gas but feels okay to go home today.  Denies worsening shortness of breath, fever, vomiting  Discharge Exam: Vitals:   11/26/21 0750 11/26/21 0751  BP:    Pulse:    Resp:    Temp:    SpO2: 98% 98%    General: Pt is alert, awake, not in acute distress.  Currently on room air. Cardiovascular: rate controlled, S1/S2 + Respiratory: bilateral decreased breath sounds at bases with some scattered crackles Abdominal: Soft, obese, NT, ND, bowel sounds + Extremities: Trace lower extremity edema; no cyanosis    The results of significant diagnostics from this hospitalization (including imaging, microbiology, ancillary and laboratory) are listed below for reference.     Microbiology: Recent Results (from the past 240 hour(s))  Urine Culture     Status: Abnormal   Collection Time: 11/20/21  7:19 AM   Specimen: Urine, Clean Catch  Result Value Ref Range Status   Specimen Description   Final    URINE, CLEAN CATCH Performed at [facility], [address] [place], [place] [address]    Special Requests   Final    NONE Performed at [facility], [address] [place], [place] [address]    Culture >=100,000 COLONIES/mL ESCHERICHIA COLI (A)  Final   Report Status 11/22/2021 FINAL  Final   Organism ID, Bacteria ESCHERICHIA COLI (A)  Final      Susceptibility   Escherichia coli - MIC*    AMPICILLIN <=2 SENSITIVE Sensitive     CEFAZOLIN <=4 SENSITIVE Sensitive     CEFEPIME <=0.12 SENSITIVE Sensitive     CEFTRIAXONE <=0.25 SENSITIVE Sensitive     CIPROFLOXACIN <=0.25 SENSITIVE Sensitive     GENTAMICIN <=1  SENSITIVE Sensitive     IMIPENEM <=0.25 SENSITIVE Sensitive     NITROFURANTOIN <=16 SENSITIVE Sensitive     TRIMETH/SULFA <=20 SENSITIVE Sensitive     AMPICILLIN/SULBACTAM <=2 SENSITIVE Sensitive     PIP/TAZO <=4 SENSITIVE Sensitive     * >=100,000 COLONIES/mL ESCHERICHIA COLI  Culture, blood (routine x 2)     Status: Abnormal   Collection Time: 11/20/21  7:20 AM   Specimen: BLOOD  Result Value Ref Range Status   Specimen Description   Final    BLOOD LEFT ANTECUBITAL Performed at [facility], [address] [place], [place] [address]    Special Requests   Final    BOTTLES DRAWN AEROBIC AND ANAEROBIC Blood Culture results may not be optimal due to an inadequate volume of blood received in culture bottles Performed at [facility], [address] [place], [place] [address]    Culture  Setup Time   Final    GRAM NEGATIVE RODS IN BOTH AEROBIC AND ANAEROBIC BOTTLES Organism ID to follow CRITICAL RESULT CALLED TO, READ  BACK BY AND VERIFIED WITH: L POINDEXTER,[email or web address]  11/20/21 [place] Performed at [facility], [place] [address], [place], [place] [address]    Culture ESCHERICHIA COLI (A)  Final   Report Status 11/22/2021 FINAL  Final   Organism ID, Bacteria ESCHERICHIA COLI  Final      Susceptibility   Escherichia coli - MIC*    AMPICILLIN <=2 SENSITIVE Sensitive     CEFAZOLIN <=4 SENSITIVE Sensitive     CEFEPIME <=0.12 SENSITIVE Sensitive     CEFTAZIDIME <=1 SENSITIVE Sensitive     CEFTRIAXONE <=0.25 SENSITIVE Sensitive     CIPROFLOXACIN <=0.25 SENSITIVE Sensitive     GENTAMICIN <=1 SENSITIVE Sensitive     IMIPENEM <=0.25 SENSITIVE Sensitive     TRIMETH/SULFA <=20 SENSITIVE Sensitive     AMPICILLIN/SULBACTAM <=2 SENSITIVE Sensitive     PIP/TAZO <=4 SENSITIVE Sensitive     * ESCHERICHIA COLI  Blood Culture ID Panel (Reflexed)     Status: Abnormal   Collection Time: 11/20/21  7:20 AM  Result Value Ref Range Status   Enterococcus faecalis NOT DETECTED  NOT DETECTED Final   Enterococcus Faecium NOT DETECTED NOT DETECTED Final   Listeria monocytogenes NOT DETECTED NOT DETECTED Final   Staphylococcus species NOT DETECTED NOT DETECTED Final   Staphylococcus aureus (BCID) NOT DETECTED NOT DETECTED Final   Staphylococcus epidermidis NOT DETECTED NOT DETECTED Final   Staphylococcus lugdunensis NOT DETECTED NOT DETECTED Final   Streptococcus species NOT DETECTED NOT DETECTED Final   Streptococcus agalactiae NOT DETECTED NOT DETECTED Final   Streptococcus pneumoniae NOT DETECTED NOT DETECTED Final   Streptococcus pyogenes NOT DETECTED NOT DETECTED Final   A.calcoaceticus-baumannii NOT DETECTED NOT DETECTED Final   Bacteroides fragilis NOT DETECTED NOT DETECTED Final   Enterobacterales DETECTED (A) NOT DETECTED Final    Comment: Enterobacterales represent a large order of gram negative bacteria, not a single organism. CRITICAL RESULT CALLED TO, READ BACK BY AND VERIFIED WITH: L POINDEXTER,[email or web address]  11/20/21 [place]    Enterobacter cloacae complex NOT DETECTED NOT DETECTED Final   Escherichia coli DETECTED (A) NOT DETECTED Final    Comment: CRITICAL RESULT CALLED TO, READ BACK BY AND VERIFIED WITH: L POINDEXTER,[email or web address]  11/20/21 [place]    Klebsiella aerogenes NOT DETECTED NOT DETECTED Final   Klebsiella oxytoca NOT DETECTED NOT DETECTED Final   Klebsiella pneumoniae NOT DETECTED NOT DETECTED Final   Proteus species NOT DETECTED NOT DETECTED Final   Salmonella species NOT DETECTED NOT DETECTED Final   Serratia marcescens NOT DETECTED NOT DETECTED Final   Haemophilus influenzae NOT DETECTED NOT DETECTED Final   Neisseria meningitidis NOT DETECTED NOT DETECTED Final   Pseudomonas aeruginosa NOT DETECTED NOT DETECTED Final   Stenotrophomonas maltophilia NOT DETECTED NOT DETECTED Final   Candida albicans NOT DETECTED NOT DETECTED Final   Candida auris NOT DETECTED NOT DETECTED Final   Candida glabrata NOT DETECTED NOT DETECTED Final   Candida  krusei NOT DETECTED NOT DETECTED Final   Candida parapsilosis NOT DETECTED NOT DETECTED Final   Candida tropicalis NOT DETECTED NOT DETECTED Final   Cryptococcus neoformans/gattii NOT DETECTED NOT DETECTED Final   CTX-M ESBL NOT DETECTED NOT DETECTED Final   Carbapenem resistance IMP NOT DETECTED NOT DETECTED Final   Carbapenem resistance KPC NOT DETECTED NOT DETECTED Final   Carbapenem resistance NDM NOT DETECTED NOT DETECTED Final   Carbapenem resist OXA 48 LIKE NOT DETECTED NOT DETECTED Final   Carbapenem resistance VIM NOT DETECTED NOT DETECTED Final    Comment: Performed at [facility], [address], [place], [place] [address]  Culture, blood (routine x 2)     Status: Abnormal   Collection Time: 11/20/21  7:50 AM   Specimen: BLOOD  Result Value Ref Range Status   Specimen Description   Final    BLOOD RIGHT ANTECUBITAL Performed at [facility], [address] [place], [place] [address]    Special Requests   Final    BOTTLES DRAWN AEROBIC AND ANAEROBIC Blood Culture results may not be optimal due to an inadequate volume of blood received in culture bottles Performed at [facility], [address] [place], [place] [address]    Culture  Setup Time   Final    GRAM NEGATIVE RODS IN BOTH AEROBIC AND ANAEROBIC BOTTLES CRITICAL VALUE NOTED.  VALUE IS CONSISTENT WITH PREVIOUSLY REPORTED AND CALLED VALUE.    Culture (A)  Final    ESCHERICHIA COLI SUSCEPTIBILITIES PERFORMED ON PREVIOUS CULTURE WITHIN THE LAST 5 DAYS. Performed at [facility], [place] [address], [place], [place] [address]    Report Status 11/22/2021 FINAL  Final  MRSA Next Gen by PCR, Nasal     Status: None   Collection Time: 11/21/21  2:44 PM   Specimen: Nasal Mucosa; Nasal Swab  Result Value Ref Range Status   MRSA by PCR Next Gen NOT DETECTED NOT DETECTED Final    Comment: (NOTE) The GeneXpert MRSA Assay (FDA approved for NASAL specimens only), is one component of a  comprehensive MRSA colonization surveillance program. It is not intended to diagnose MRSA infection nor to guide or monitor treatment for MRSA infections. Test performance is not FDA approved in patients less than 24 years old. Performed at [facility], [place] [address] [place], [place] [address]      Labs: BNP (last 3 results) Recent Labs    11/22/21 0745  BNP 0000000*   Basic Metabolic Panel: Recent Labs  Lab 11/22/21 0242 11/23/21 0230 11/24/21 0447 11/25/21 0512 11/26/21 0528  NA 139 141 141 144 148*  K 3.8 3.4* 3.4* 2.9* 3.1*  CL 112* 108 106 103 105  CO2 19* 22 26 28  33*  GLUCOSE 121* 150* 119* 121* 112*  BUN 20 19 22 20 21   CREATININE 1.71* 1.60* 1.54* 1.39* 1.21*  CALCIUM 8.0* 8.3* 8.3* 8.2* 8.8*  MG 2.7* 2.3 2.0 1.8 2.4   Liver Function Tests: Recent Labs  Lab 11/20/21 0720  AST 52*  ALT 92*  ALKPHOS 110  BILITOT 1.9*  PROT 6.8  ALBUMIN 2.8*   Recent Labs  Lab 11/20/21 0720  LIPASE 22   No results for input(s): "AMMONIA" in the last 168 hours. CBC: Recent Labs  Lab 11/22/21 0242 11/23/21 0230 11/24/21 0447 11/25/21 0512 11/26/21 0528  WBC 8.1 8.4 10.0 10.8* 12.4*  HGB 8.5* 9.7* 9.7* 10.3* 10.5*  HCT 26.3* 29.0* 29.0* 30.9* 31.5*  MCV 100.4* 97.0 96.7 97.2 97.2  PLT 133* 189 269 312 377   Cardiac Enzymes: No results for input(s): "CKTOTAL", "CKMB", "CKMBINDEX", "TROPONINI" in the last 168 hours. BNP: Invalid input(s): "POCBNP" CBG: Recent Labs  Lab 11/23/21 0727 11/24/21 0838 11/24/21 1138 11/25/21 0805 11/26/21 0725  GLUCAP 159* 98 89 84 87   D-Dimer No results for input(s): "DDIMER" in the last 72 hours. Hgb A1c No results for input(s): "HGBA1C" in the last 72 hours. Lipid Profile No results for input(s): "CHOL", "HDL", "LDLCALC", "TRIG", "CHOLHDL", "LDLDIRECT" in the last 72 hours. Thyroid function studies No  results for input(s): "TSH", "T4TOTAL", "T3FREE", "THYROIDAB" in the last 72 hours.  Invalid  input(s): "FREET3" Anemia work up No results for input(s): "VITAMINB12", "FOLATE", "FERRITIN", "TIBC", "IRON", "RETICCTPCT" in the last 72 hours. Urinalysis    Component Value Date/Time   COLORURINE YELLOW 11/20/2021 0719   APPEARANCEUR CLOUDY (A) 11/20/2021 0719   LABSPEC 1.015 11/20/2021 0719   PHURINE 6.0 11/20/2021 0719   GLUCOSEU NEGATIVE 11/20/2021 0719   HGBUR MODERATE (A) 11/20/2021 0719   BILIRUBINUR SMALL (A) 11/20/2021 0719   KETONESUR NEGATIVE 11/20/2021 0719   PROTEINUR >=300 (A) 11/20/2021 0719   UROBILINOGEN 0.2 07/09/2008 0750   NITRITE NEGATIVE 11/20/2021 0719   LEUKOCYTESUR NEGATIVE 11/20/2021 0719   Sepsis Labs Recent Labs  Lab 11/23/21 0230 11/24/21 0447 11/25/21 0512 11/26/21 0528  WBC 8.4 10.0 10.8* 12.4*   Microbiology Recent Results (from the past 240 hour(s))  Urine Culture     Status: Abnormal   Collection Time: 11/20/21  7:19 AM   Specimen: Urine, Clean Catch  Result Value Ref Range Status   Specimen Description   Final    URINE, CLEAN CATCH Performed at [facility], [address] [place], [place] [address]    Special Requests   Final    NONE Performed at [facility], [address] [place], [place] [address]    Culture >=100,000 COLONIES/mL ESCHERICHIA COLI (A)  Final   Report Status 11/22/2021 FINAL  Final   Organism ID, Bacteria ESCHERICHIA COLI (A)  Final      Susceptibility   Escherichia coli - MIC*    AMPICILLIN <=2 SENSITIVE Sensitive     CEFAZOLIN <=4 SENSITIVE Sensitive     CEFEPIME <=0.12 SENSITIVE Sensitive     CEFTRIAXONE <=0.25 SENSITIVE Sensitive     CIPROFLOXACIN <=0.25 SENSITIVE Sensitive     GENTAMICIN <=1 SENSITIVE Sensitive     IMIPENEM <=0.25 SENSITIVE Sensitive     NITROFURANTOIN <=16 SENSITIVE Sensitive     TRIMETH/SULFA <=20 SENSITIVE Sensitive     AMPICILLIN/SULBACTAM <=2 SENSITIVE Sensitive     PIP/TAZO <=4 SENSITIVE Sensitive     * >=100,000 COLONIES/mL ESCHERICHIA COLI  Culture,  blood (routine x 2)     Status: Abnormal   Collection Time: 11/20/21  7:20 AM   Specimen: BLOOD  Result Value Ref Range Status   Specimen Description   Final    BLOOD LEFT ANTECUBITAL Performed at [facility], [address] [place], [place] [address]    Special Requests   Final    BOTTLES DRAWN AEROBIC AND ANAEROBIC Blood Culture results may not be optimal due to an inadequate volume of blood received in culture bottles Performed at [facility], [address] [place], [place] [address]    Culture  Setup Time   Final    GRAM NEGATIVE RODS IN BOTH AEROBIC AND ANAEROBIC BOTTLES Organism ID to follow CRITICAL RESULT CALLED TO, READ BACK BY AND VERIFIED WITH: L POINDEXTER,[email or web address]  11/20/21 MK Performed at [facility], [address], [place], [place] [address]    Culture ESCHERICHIA COLI (A)  Final   Report Status 11/22/2021 FINAL  Final   Organism ID, Bacteria ESCHERICHIA COLI  Final      Susceptibility   Escherichia coli - MIC*    AMPICILLIN <=2 SENSITIVE Sensitive     CEFAZOLIN <=4 SENSITIVE Sensitive     CEFEPIME <=0.12 SENSITIVE Sensitive     CEFTAZIDIME <=1 SENSITIVE Sensitive  CEFTRIAXONE <=0.25 SENSITIVE Sensitive     CIPROFLOXACIN <=0.25 SENSITIVE Sensitive     GENTAMICIN <=1 SENSITIVE Sensitive     IMIPENEM <=0.25 SENSITIVE Sensitive     TRIMETH/SULFA <=20 SENSITIVE Sensitive     AMPICILLIN/SULBACTAM <=2 SENSITIVE Sensitive     PIP/TAZO <=4 SENSITIVE Sensitive     * ESCHERICHIA COLI  Blood Culture ID Panel (Reflexed)     Status: Abnormal   Collection Time: 11/20/21  7:20 AM  Result Value Ref Range Status   Enterococcus faecalis NOT DETECTED NOT DETECTED Final   Enterococcus Faecium NOT DETECTED NOT DETECTED Final   Listeria monocytogenes NOT DETECTED NOT DETECTED Final   Staphylococcus species NOT DETECTED NOT DETECTED Final   Staphylococcus aureus (BCID) NOT DETECTED NOT DETECTED Final   Staphylococcus epidermidis NOT DETECTED  NOT DETECTED Final   Staphylococcus lugdunensis NOT DETECTED NOT DETECTED Final   Streptococcus species NOT DETECTED NOT DETECTED Final   Streptococcus agalactiae NOT DETECTED NOT DETECTED Final   Streptococcus pneumoniae NOT DETECTED NOT DETECTED Final   Streptococcus pyogenes NOT DETECTED NOT DETECTED Final   A.calcoaceticus-baumannii NOT DETECTED NOT DETECTED Final   Bacteroides fragilis NOT DETECTED NOT DETECTED Final   Enterobacterales DETECTED (A) NOT DETECTED Final    Comment: Enterobacterales represent a large order of gram negative bacteria, not a single organism. CRITICAL RESULT CALLED TO, READ BACK BY AND VERIFIED WITH: L POINDEXTER,[email or web address]  11/20/21 [place]    Enterobacter cloacae complex NOT DETECTED NOT DETECTED Final   Escherichia coli DETECTED (A) NOT DETECTED Final    Comment: CRITICAL RESULT CALLED TO, READ BACK BY AND VERIFIED WITH: L POINDEXTER,[email or web address]  11/20/21 [place]    Klebsiella aerogenes NOT DETECTED NOT DETECTED Final   Klebsiella oxytoca NOT DETECTED NOT DETECTED Final   Klebsiella pneumoniae NOT DETECTED NOT DETECTED Final   Proteus species NOT DETECTED NOT DETECTED Final   Salmonella species NOT DETECTED NOT DETECTED Final   Serratia marcescens NOT DETECTED NOT DETECTED Final   Haemophilus influenzae NOT DETECTED NOT DETECTED Final   Neisseria meningitidis NOT DETECTED NOT DETECTED Final   Pseudomonas aeruginosa NOT DETECTED NOT DETECTED Final   Stenotrophomonas maltophilia NOT DETECTED NOT DETECTED Final   Candida albicans NOT DETECTED NOT DETECTED Final   Candida auris NOT DETECTED NOT DETECTED Final   Candida glabrata NOT DETECTED NOT DETECTED Final   Candida krusei NOT DETECTED NOT DETECTED Final   Candida parapsilosis NOT DETECTED NOT DETECTED Final   Candida tropicalis NOT DETECTED NOT DETECTED Final   Cryptococcus neoformans/gattii NOT DETECTED NOT DETECTED Final   CTX-M ESBL NOT DETECTED NOT DETECTED Final   Carbapenem resistance IMP NOT DETECTED  NOT DETECTED Final   Carbapenem resistance KPC NOT DETECTED NOT DETECTED Final   Carbapenem resistance NDM NOT DETECTED NOT DETECTED Final   Carbapenem resist OXA 48 LIKE NOT DETECTED NOT DETECTED Final   Carbapenem resistance VIM NOT DETECTED NOT DETECTED Final    Comment: Performed at [facility], [address], [place], [place] [address]  Culture, blood (routine x 2)     Status: Abnormal   Collection Time: 11/20/21  7:50 AM   Specimen: BLOOD  Result Value Ref Range Status   Specimen Description   Final    BLOOD RIGHT ANTECUBITAL Performed at [facility], [address] [place], [place] [address]    Special Requests   Final    BOTTLES DRAWN AEROBIC AND ANAEROBIC Blood Culture results may not be optimal due to an inadequate volume of blood  received in culture bottles Performed at [facility], [address] [place], [place] [address]    Culture  Setup Time   Final    GRAM NEGATIVE RODS IN BOTH AEROBIC AND ANAEROBIC BOTTLES CRITICAL VALUE NOTED.  VALUE IS CONSISTENT WITH PREVIOUSLY REPORTED AND CALLED VALUE.    Culture (A)  Final    ESCHERICHIA COLI SUSCEPTIBILITIES PERFORMED ON PREVIOUS CULTURE WITHIN THE LAST 5 DAYS. Performed at [facility], [place] [address] [place], [place] [address]    Report Status 11/22/2021 FINAL  Final  MRSA Next Gen by PCR, Nasal     Status: None   Collection Time: 11/21/21  2:44 PM   Specimen: Nasal Mucosa; Nasal Swab  Result Value Ref Range Status   MRSA by PCR Next Gen NOT DETECTED NOT DETECTED Final    Comment: (NOTE) The GeneXpert MRSA Assay (FDA approved for NASAL specimens only), is one component of a comprehensive MRSA colonization surveillance program. It is not intended to diagnose MRSA infection nor to guide or monitor treatment for MRSA infections. Test performance is not FDA approved in patients less than 23 years old. Performed at [facility], [place] [address], [place],  [place] [address]      Time coordinating discharge: 35 minutes  SIGNED:   Aline August, MD  [facility] 11/26/2021, 10:29 AM

## 2021-11-26 NOTE — Progress Notes (Signed)
Chaplain completed notarization of Scientist, water quality, Healthcare POA.  Chaplain was able to input a copy in [name]'s medical chart, provide her three copies including the original, and email a copy to have it scanned into her virtual medical chart.      11/26/21 1400  Clinical Encounter Type  Visited With Patient  Visit Type Follow-up;Social support

## 2022-02-18 ENCOUNTER — Encounter (HOSPITAL_BASED_OUTPATIENT_CLINIC_OR_DEPARTMENT_OTHER): Payer: Self-pay | Admitting: Emergency Medicine

## 2022-02-18 ENCOUNTER — Emergency Department (HOSPITAL_BASED_OUTPATIENT_CLINIC_OR_DEPARTMENT_OTHER)
Admission: EM | Admit: 2022-02-18 | Discharge: 2022-02-19 | Disposition: A | Discharge status: Home / Self Care | Payer: BC Managed Care – PPO | Attending: Emergency Medicine | Admitting: Emergency Medicine

## 2022-02-18 ENCOUNTER — Emergency Department (HOSPITAL_BASED_OUTPATIENT_CLINIC_OR_DEPARTMENT_OTHER): Payer: BC Managed Care – PPO

## 2022-02-18 DIAGNOSIS — M546 Pain in thoracic spine: Secondary | ICD-10-CM | POA: Diagnosis not present

## 2022-02-18 DIAGNOSIS — Y92410 Unspecified street and highway as the place of occurrence of the external cause: Secondary | ICD-10-CM | POA: Diagnosis not present

## 2022-02-18 DIAGNOSIS — R0789 Other chest pain: Secondary | ICD-10-CM | POA: Diagnosis not present

## 2022-02-18 DIAGNOSIS — R1011 Right upper quadrant pain: Secondary | ICD-10-CM | POA: Insufficient documentation

## 2022-02-18 DIAGNOSIS — M542 Cervicalgia: Secondary | ICD-10-CM | POA: Diagnosis present

## 2022-02-18 DIAGNOSIS — Z91040 Latex allergy status: Secondary | ICD-10-CM | POA: Diagnosis not present

## 2022-02-18 DIAGNOSIS — R1031 Right lower quadrant pain: Secondary | ICD-10-CM | POA: Insufficient documentation

## 2022-02-18 LAB — BASIC METABOLIC PANEL
Anion gap: 6 (ref 5–15)
BUN: 10 mg/dL (ref 8–23)
CO2: 26 mmol/L (ref 22–32)
Calcium: 9.4 mg/dL (ref 8.9–10.3)
Chloride: 107 mmol/L (ref 98–111)
Creatinine, Ser: 0.98 mg/dL (ref 0.44–1.00)
GFR, Estimated: >60 mL/min (ref >60)
Glucose, Bld: 90 mg/dL (ref 70–99)
Potassium: 3.8 mmol/L (ref 3.5–5.1)
Sodium: 139 mmol/L (ref 135–145)

## 2022-02-18 LAB — CBC WITH DIFFERENTIAL/PLATELET
Abs Immature Granulocytes: 0.02 10*3/uL (ref 0.00–0.07)
Basophils Absolute: 0.1 10*3/uL (ref 0.0–0.1)
Basophils Relative: 1 %
Eosinophils Absolute: 0.1 10*3/uL (ref 0.0–0.5)
Eosinophils Relative: 2 %
HCT: 39.6 % (ref 36.0–46.0)
Hemoglobin: 12.7 g/dL (ref 12.0–15.0)
Immature Granulocytes: 0 %
Lymphocytes Relative: 41 %
Lymphs Abs: 2.3 10*3/uL (ref 0.7–4.0)
MCH: 31.5 pg (ref 26.0–34.0)
MCHC: 32.1 g/dL (ref 30.0–36.0)
MCV: 98.3 fL (ref 80.0–100.0)
Monocytes Absolute: 0.5 10*3/uL (ref 0.1–1.0)
Monocytes Relative: 9 %
Neutro Abs: 2.6 10*3/uL (ref 1.7–7.7)
Neutrophils Relative %: 47 %
Platelets: 323 10*3/uL (ref 150–400)
RBC: 4.03 MIL/uL (ref 3.87–5.11)
RDW: 12 % (ref 11.5–15.5)
WBC: 5.6 10*3/uL (ref 4.0–10.5)
nRBC: 0 % (ref 0.0–0.2)

## 2022-02-18 MED ORDER — IOHEXOL 300 MG/ML  SOLN
100.0000 mL | Freq: Once | INTRAMUSCULAR | Status: AC | PRN
  Administered 2022-02-18: 100 mL via INTRAVENOUS
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
## 2023-06-28 ENCOUNTER — Ambulatory Visit
Admission: EM | Admit: 2023-06-28 | Discharge: 2023-06-28 | Disposition: A | Discharge status: Home / Self Care | Attending: Family Medicine | Admitting: Family Medicine

## 2023-06-28 ENCOUNTER — Ambulatory Visit (HOSPITAL_BASED_OUTPATIENT_CLINIC_OR_DEPARTMENT_OTHER)
Admission: RE | Admit: 2023-06-28 | Discharge: 2023-06-28 | Disposition: A | Discharge status: Home / Self Care | Source: Ambulatory Visit | Attending: Urgent Care | Admitting: Urgent Care

## 2023-06-28 DIAGNOSIS — M542 Cervicalgia: Secondary | ICD-10-CM | POA: Insufficient documentation

## 2023-06-28 DIAGNOSIS — M25551 Pain in right hip: Secondary | ICD-10-CM | POA: Diagnosis not present

## 2023-06-28 DIAGNOSIS — M25552 Pain in left hip: Secondary | ICD-10-CM

## 2023-06-28 DIAGNOSIS — S161XXA Strain of muscle, fascia and tendon at neck level, initial encounter: Secondary | ICD-10-CM | POA: Diagnosis not present

## 2023-06-28 MED ORDER — PREDNISONE 20 MG PO TABS: ORAL_TABLET | ORAL | 0 refills | Status: AC

## 2023-06-28 MED ORDER — CYCLOBENZAPRINE HCL 5 MG PO TABS: 5.0000 mg | ORAL_TABLET | Freq: Every evening | ORAL | 0 refills | Status: AC | PRN

## 2023-06-28 NOTE — Discharge Instructions (Signed)
 I have placed orders to have an x-ray done at the med center in [facility].  Please check in through the emergency room but do not register to be seen as a patient.  They will contact the radiology department and a staff member will take you to go get the x-ray done.   Start prednisone to help with your pains. Use cyclobenzaprine as a muscle relaxant.
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
## 2023-07-01 ENCOUNTER — Encounter: Payer: Self-pay | Admitting: Urgent Care

## 2023-08-26 ENCOUNTER — Emergency Department (HOSPITAL_BASED_OUTPATIENT_CLINIC_OR_DEPARTMENT_OTHER)
Admission: EM | Admit: 2023-08-26 | Discharge: 2023-08-26 | Disposition: A | Discharge status: Home / Self Care | Attending: Emergency Medicine | Admitting: Emergency Medicine

## 2023-08-26 ENCOUNTER — Emergency Department (HOSPITAL_BASED_OUTPATIENT_CLINIC_OR_DEPARTMENT_OTHER)

## 2023-08-26 ENCOUNTER — Other Ambulatory Visit: Payer: Self-pay

## 2023-08-26 ENCOUNTER — Encounter (HOSPITAL_BASED_OUTPATIENT_CLINIC_OR_DEPARTMENT_OTHER): Payer: Self-pay | Admitting: Emergency Medicine

## 2023-08-26 DIAGNOSIS — R1084 Generalized abdominal pain: Secondary | ICD-10-CM | POA: Diagnosis present

## 2023-08-26 DIAGNOSIS — R1012 Left upper quadrant pain: Secondary | ICD-10-CM | POA: Diagnosis not present

## 2023-08-26 DIAGNOSIS — D72829 Elevated white blood cell count, unspecified: Secondary | ICD-10-CM | POA: Diagnosis not present

## 2023-08-26 DIAGNOSIS — Z91040 Latex allergy status: Secondary | ICD-10-CM | POA: Insufficient documentation

## 2023-08-26 DIAGNOSIS — R110 Nausea: Secondary | ICD-10-CM | POA: Insufficient documentation

## 2023-08-26 DIAGNOSIS — R197 Diarrhea, unspecified: Secondary | ICD-10-CM | POA: Diagnosis not present

## 2023-08-26 LAB — CBC
HCT: 42.1 % (ref 36.0–46.0)
Hemoglobin: 13.7 g/dL (ref 12.0–15.0)
MCH: 31.1 pg (ref 26.0–34.0)
MCHC: 32.5 g/dL (ref 30.0–36.0)
MCV: 95.5 fL (ref 80.0–100.0)
Platelets: 239 10*3/uL (ref 150–400)
RBC: 4.41 MIL/uL (ref 3.87–5.11)
RDW: 12.4 % (ref 11.5–15.5)
WBC: 5.5 10*3/uL (ref 4.0–10.5)
nRBC: 0 % (ref 0.0–0.2)

## 2023-08-26 LAB — COMPREHENSIVE METABOLIC PANEL WITH GFR
ALT: 22 U/L (ref 0–44)
AST: 20 U/L (ref 15–41)
Albumin: 4.6 g/dL (ref 3.5–5.0)
Alkaline Phosphatase: 78 U/L (ref 38–126)
Anion gap: 15 (ref 5–15)
BUN: 16 mg/dL (ref 8–23)
CO2: 24 mmol/L (ref 22–32)
Calcium: 10.5 mg/dL — ABNORMAL HIGH (ref 8.9–10.3)
Chloride: 99 mmol/L (ref 98–111)
Creatinine, Ser: 1.39 mg/dL — ABNORMAL HIGH (ref 0.44–1.00)
GFR, Estimated: 42 mL/min — ABNORMAL LOW (ref >60)
Glucose, Bld: 85 mg/dL (ref 70–99)
Potassium: 4.1 mmol/L (ref 3.5–5.1)
Sodium: 137 mmol/L (ref 135–145)
Total Bilirubin: 0.3 mg/dL (ref 0.0–1.2)
Total Protein: 7.7 g/dL (ref 6.5–8.1)

## 2023-08-26 LAB — URINALYSIS, ROUTINE W REFLEX MICROSCOPIC
Bilirubin Urine: NEGATIVE
Glucose, UA: NEGATIVE mg/dL
Hgb urine dipstick: NEGATIVE
Ketones, ur: NEGATIVE mg/dL
Nitrite: NEGATIVE
Protein, ur: NEGATIVE mg/dL
Specific Gravity, Urine: 1.02 (ref 1.005–1.030)
pH: 6 (ref 5.0–8.0)

## 2023-08-26 LAB — URINALYSIS, MICROSCOPIC (REFLEX): RBC / HPF: NONE SEEN RBC/hpf (ref 0–5)

## 2023-08-26 LAB — MAGNESIUM: Magnesium: 1.8 mg/dL (ref 1.7–2.4)

## 2023-08-26 LAB — LIPASE, BLOOD: Lipase: 18 U/L (ref 11–51)

## 2023-08-26 MED ORDER — ONDANSETRON HCL 4 MG/2ML IJ SOLN
4.0000 mg | Freq: Once | INTRAMUSCULAR | Status: AC
  Administered 2023-08-26: 4 mg via INTRAVENOUS
  Filled 2023-08-26: qty 2

## 2023-08-26 MED ORDER — ONDANSETRON 4 MG PO TBDP: 4.0000 mg | ORAL_TABLET | Freq: Three times a day (TID) | ORAL | 0 refills | Status: AC | PRN

## 2023-08-26 MED ORDER — PANTOPRAZOLE SODIUM 40 MG IV SOLR
40.0000 mg | Freq: Once | INTRAVENOUS | Status: AC
  Administered 2023-08-26: 40 mg via INTRAVENOUS
  Filled 2023-08-26: qty 10

## 2023-08-26 MED ORDER — LOPERAMIDE HCL 2 MG PO CAPS: 2.0000 mg | ORAL_CAPSULE | Freq: Four times a day (QID) | ORAL | 0 refills | Status: AC | PRN

## 2023-08-26 MED ORDER — MORPHINE SULFATE (PF) 4 MG/ML IV SOLN
4.0000 mg | Freq: Once | INTRAVENOUS | Status: AC
  Administered 2023-08-26: 4 mg via INTRAVENOUS
  Filled 2023-08-26: qty 1

## 2023-08-26 MED ORDER — SODIUM CHLORIDE 0.9 % IV BOLUS
1000.0000 mL | Freq: Once | INTRAVENOUS | Status: AC
  Administered 2023-08-26: 1000 mL via INTRAVENOUS
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
## 2023-08-26 NOTE — ED Notes (Signed)
 Pt. Is in the restroom  attempting to get a stool sample at present time.

## 2023-08-26 NOTE — Discharge Instructions (Addendum)
 You were seen in the emergency department for 3 weeks of abdominal pain and diarrhea.  You had lab work and a CAT scan of your abdomen and pelvis that did not show an obvious explanation for your symptoms.  Stool studies were ordered.  We are prescribing you some nausea medication and diarrhea medicine.  Please stay well-hydrated and contact your primary care doctor for close follow-up.  You may ultimately need a referral to a GI doctor.  Return to the emergency department if any worsening or concerning symptoms

## 2023-08-26 NOTE — ED Provider Notes (Signed)
 [facility] Provider Note   CSN: [id number] Arrival date & time: 08/26/23  1817     History {Add pertinent medical, surgical, social history, OB history to HPI:1} Chief Complaint  Patient presents with   Abdominal Pain    [name] is a 68 y.o. female.  She is here with a complaint of 3 weeks of generalized abdominal pain and loose stools.  Nausea no vomiting.  No blood in the stool.  No fever.  Has tried nothing for her symptoms.  No fevers or chills.  No recent travel.  No prior abdominal surgeries.  No recent antibiotics  The history is provided by the patient.  Abdominal Pain Pain location:  Generalized Pain quality: aching and cramping   Pain radiates to:  Back Pain severity:  Severe Onset quality:  Gradual Duration:  3 weeks Timing:  Constant Progression:  Worsening Chronicity:  New Context: not recent travel and not trauma   Relieved by:  None tried Worsened by:  Nothing Ineffective treatments:  None tried Associated symptoms: diarrhea and nausea   Associated symptoms: no chest pain, no constipation, no cough, no dysuria, no fever, no hematemesis, no hematochezia, no shortness of breath and no vomiting        Home Medications Prior to Admission medications   Medication Sig Start Date End Date Taking? Authorizing Provider  acetaminophen  (TYLENOL ) 325 MG tablet Take 650 mg by mouth daily as needed (pain).    [provider]  albuterol  (VENTOLIN  HFA) 108 (90 Base) MCG/ACT inhaler Inhale 2 puffs into the lungs every 4 (four) hours as needed for wheezing or shortness of breath. 11/26/21   Audria Leather, MD  BIOTIN PO Take 2 tablets by mouth daily.    [provider]  Calcium  Carbonate Antacid (TUMS PO) Take 2 tablets by mouth as needed (acid reflux).    [provider]  CALCIUM -VITAMINS C & D PO Take 3 tablets by mouth daily.    [provider]  cyclobenzaprine  (FLEXERIL ) 5 MG tablet  Take 1 tablet (5 mg total) by mouth at bedtime as needed. 06/28/23   Adolph Hoop, PA-C  Multiple Vitamins-Minerals (MULTIVITAMIN WITH MINERALS) tablet Take 2 tablets by mouth daily.    [provider]  pantoprazole  (PROTONIX ) 40 MG tablet Take 40 mg by mouth as needed (acid reflux).    [provider]  predniSONE  (DELTASONE ) 20 MG tablet Take 2 tablets daily with breakfast. 06/28/23   Adolph Hoop, PA-C  traZODone  (DESYREL ) 100 MG tablet Take 100 mg by mouth at bedtime. 10/23/21   [provider]  traZODone  (DESYREL ) 50 MG tablet Take 50 mg by mouth at bedtime.    [provider]  venlafaxine  XR (EFFEXOR -XR) 150 MG 24 hr capsule Take 150 mg by mouth daily with breakfast.    [provider]      Allergies    Hydromorphone  hcl, Latex, Hydromorphone  hcl, and Oxycodone -acetaminophen     Review of Systems   Review of Systems  Constitutional:  Negative for fever.  Respiratory:  Negative for cough and shortness of breath.   Cardiovascular:  Negative for chest pain.  Gastrointestinal:  Positive for abdominal pain, diarrhea and nausea. Negative for constipation, hematemesis, hematochezia and vomiting.  Genitourinary:  Negative for dysuria.    Physical Exam Updated Vital Signs BP (!) 134/107   Pulse 81   Temp 98.2 F (36.8 C)   Resp (!) 28   SpO2 100%  Physical Exam Vitals and  nursing note reviewed.  Constitutional:      General: She is in acute distress.     Appearance: Normal appearance. She is well-developed.  HENT:     Head: Normocephalic and atraumatic.  Eyes:     Conjunctiva/sclera: Conjunctivae normal.  Cardiovascular:     Rate and Rhythm: Normal rate and regular rhythm.     Heart sounds: No murmur heard.    No friction rub.  Pulmonary:     Effort: Pulmonary effort is normal. No respiratory distress.     Breath sounds: Normal breath sounds. No stridor. No wheezing.  Abdominal:     Palpations: Abdomen is soft.     Tenderness: There is  generalized abdominal tenderness and tenderness in the left upper quadrant. There is no guarding or rebound.  Musculoskeletal:        General: No tenderness or deformity. Normal range of motion.     Cervical back: Neck supple.  Skin:    General: Skin is warm and dry.  Neurological:     General: No focal deficit present.     Mental Status: She is alert.     GCS: GCS eye subscore is 4. GCS verbal subscore is 5. GCS motor subscore is 6.     ED Results / Procedures / Treatments   Labs (all labs ordered are listed, but only abnormal results are displayed) Labs Reviewed  GASTROINTESTINAL PANEL BY PCR, STOOL (REPLACES STOOL CULTURE)  C DIFFICILE QUICK SCREEN W PCR REFLEX    LIPASE, BLOOD  COMPREHENSIVE METABOLIC PANEL WITH GFR  CBC  URINALYSIS, ROUTINE W REFLEX MICROSCOPIC    EKG None  Radiology No results found.  Procedures Procedures  {Document cardiac monitor, telemetry assessment procedure when appropriate:1}  Medications Ordered in ED Medications  sodium chloride  0.9 % bolus 1,000 mL (has no administration in time range)  ondansetron  (ZOFRAN ) injection 4 mg (has no administration in time range)  pantoprazole  (PROTONIX ) injection 40 mg (has no administration in time range)  morphine  (PF) 4 MG/ML injection 4 mg (has no administration in time range)    ED Course/ Medical Decision Making/ A&P   {   Click here for ABCD2, HEART and other calculatorsREFRESH Note before signing :1}                              Medical Decision Making Amount and/or Complexity of Data Reviewed Labs: ordered.  Risk Prescription drug management.   This patient complains of ***; this involves an extensive number of treatment Options and is a complaint that carries with it a high risk of complications and morbidity. The differential includes ***  I ordered, reviewed and interpreted labs, which included *** I ordered medication *** and reviewed PMP when indicated. I ordered imaging  studies which included *** and I independently    visualized and interpreted imaging which showed *** Additional history obtained from *** Previous records obtained and reviewed *** I consulted *** and discussed lab and imaging findings and discussed disposition.  Cardiac monitoring reviewed, *** Social determinants considered, *** Critical Interventions: ***  After the interventions stated above, I reevaluated the patient and found *** Admission and further testing considered, ***   {Document critical care time when appropriate:1} {Document review of labs and clinical decision tools ie heart score, Chads2Vasc2 etc:1}  {Document your independent review of radiology images, and any outside records:1} {Document your discussion with family members, caretakers, and with consultants:1} {Document social determinants of  health affecting pt's care:1} {Document your decision making why or why not admission, treatments were needed:1} Final Clinical Impression(s) / ED Diagnoses Final diagnoses:  None    Rx / DC Orders ED Discharge Orders     None
